# Patient Record
Sex: Male | Born: 1945
Health system: Southern US, Community
[De-identification: ages and names within clinical notes are randomized; demographics above are authoritative.]

## PROBLEM LIST (undated history)

## (undated) DIAGNOSIS — Z85850 Personal history of malignant neoplasm of thyroid: Secondary | ICD-10-CM

## (undated) DIAGNOSIS — I2510 Atherosclerotic heart disease of native coronary artery without angina pectoris: Secondary | ICD-10-CM

## (undated) DIAGNOSIS — E079 Disorder of thyroid, unspecified: Secondary | ICD-10-CM

## (undated) DIAGNOSIS — I10 Essential (primary) hypertension: Secondary | ICD-10-CM

## (undated) DIAGNOSIS — E785 Hyperlipidemia, unspecified: Secondary | ICD-10-CM

## (undated) DIAGNOSIS — R739 Hyperglycemia, unspecified: Secondary | ICD-10-CM

## (undated) HISTORY — DX: Hyperlipidemia, unspecified: E78.5

## (undated) HISTORY — DX: Personal history of malignant neoplasm of thyroid: Z85.850

## (undated) HISTORY — PX: OTHER SURGICAL HISTORY: SHX169

## (undated) HISTORY — DX: Atherosclerotic heart disease of native coronary artery without angina pectoris: I25.10

## (undated) HISTORY — DX: Hyperglycemia, unspecified: R73.9

## (undated) HISTORY — DX: Disorder of thyroid, unspecified: E07.9

## (undated) HISTORY — DX: Essential (primary) hypertension: I10

---

## 1998-07-11 ENCOUNTER — Encounter: Payer: Self-pay | Admitting: Family Medicine

## 1998-07-11 ENCOUNTER — Ambulatory Visit (HOSPITAL_COMMUNITY): Admission: RE | Admit: 1998-07-11 | Discharge: 1998-07-11 | Payer: Self-pay | Admitting: Family Medicine

## 1998-11-25 ENCOUNTER — Other Ambulatory Visit: Admission: RE | Admit: 1998-11-25 | Discharge: 1998-11-25 | Payer: Self-pay | Admitting: General Surgery

## 1999-03-27 ENCOUNTER — Ambulatory Visit (HOSPITAL_COMMUNITY): Admission: RE | Admit: 1999-03-27 | Discharge: 1999-03-27 | Payer: Self-pay | Admitting: Family Medicine

## 1999-03-27 ENCOUNTER — Encounter: Payer: Self-pay | Admitting: Family Medicine

## 1999-09-19 DIAGNOSIS — I10 Essential (primary) hypertension: Secondary | ICD-10-CM | POA: Insufficient documentation

## 2004-05-08 ENCOUNTER — Ambulatory Visit: Payer: Self-pay | Admitting: Family Medicine

## 2004-05-18 ENCOUNTER — Ambulatory Visit: Payer: Self-pay | Admitting: Family Medicine

## 2004-06-05 ENCOUNTER — Ambulatory Visit: Payer: Self-pay | Admitting: Family Medicine

## 2004-07-16 DIAGNOSIS — E890 Postprocedural hypothyroidism: Secondary | ICD-10-CM | POA: Insufficient documentation

## 2004-07-16 HISTORY — PX: THYROIDECTOMY: SHX17

## 2004-07-24 ENCOUNTER — Ambulatory Visit: Payer: Self-pay | Admitting: Family Medicine

## 2004-08-08 ENCOUNTER — Ambulatory Visit: Payer: Self-pay | Admitting: Family Medicine

## 2004-12-14 ENCOUNTER — Other Ambulatory Visit: Payer: Self-pay

## 2004-12-20 ENCOUNTER — Ambulatory Visit: Payer: Self-pay | Admitting: General Surgery

## 2005-01-03 DIAGNOSIS — C73 Malignant neoplasm of thyroid gland: Secondary | ICD-10-CM | POA: Insufficient documentation

## 2005-02-05 IMAGING — CT CT NECK WITH CONTRAST
1 of 3 series · 9 of 14 positions shown, 12 images · non-contrast
Comparison: none

REASON FOR EXAM: dysphagia
COMMENTS:

[Series 3: inspace · axial · 0.45mm/px · z∈[-378,-155]mm · 9 of 349 slices shown, 12 images]
[im 35/349  soft-tissue]
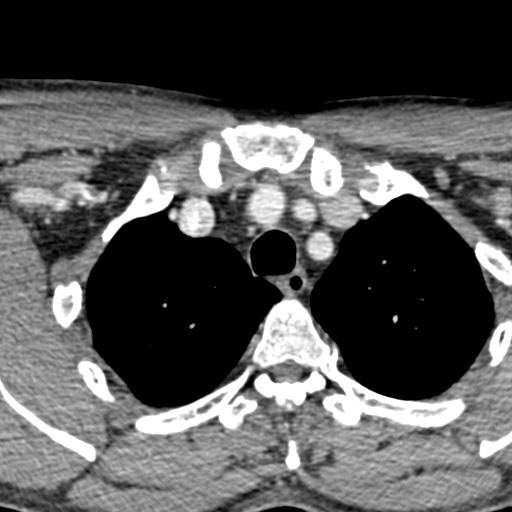
[im 35/349  bone]
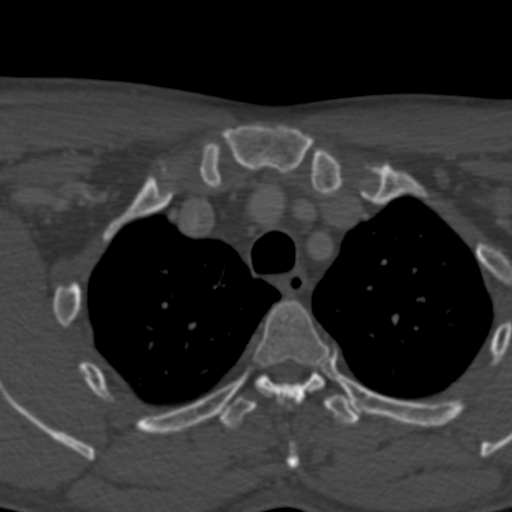
[im 70/349  bone]
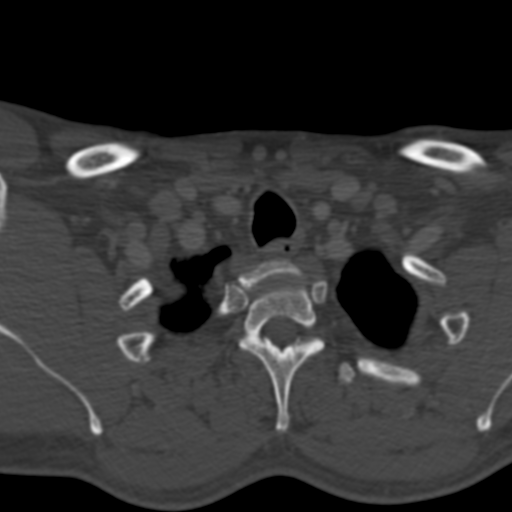
[im 105/349  bone]
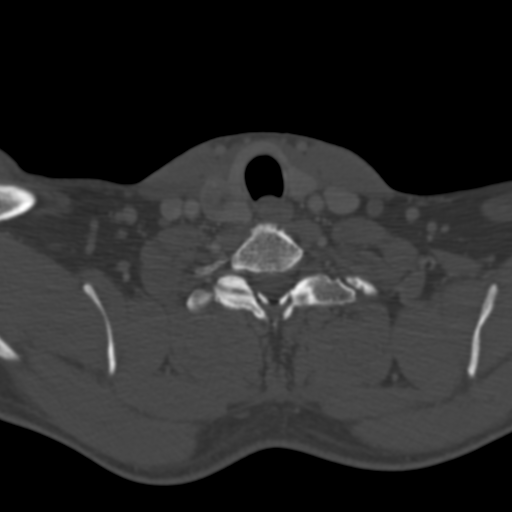
[im 140/349  bone]
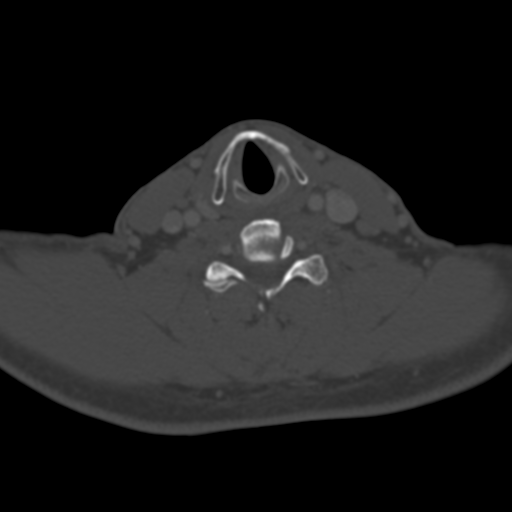
[im 175/349  soft-tissue]
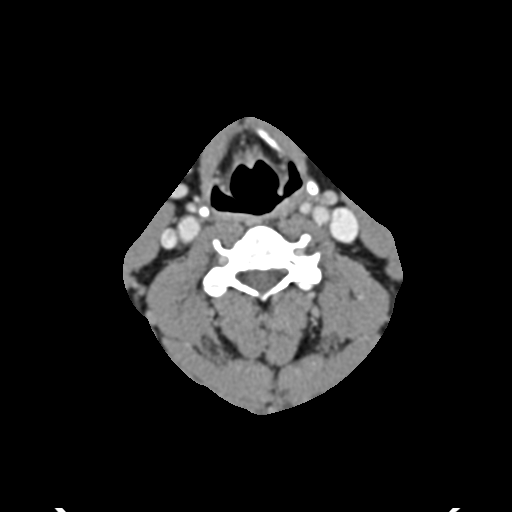
[im 175/349  bone]
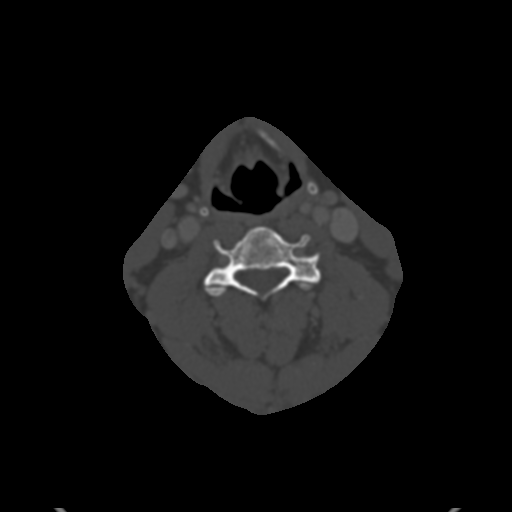
[im 209/349  bone]
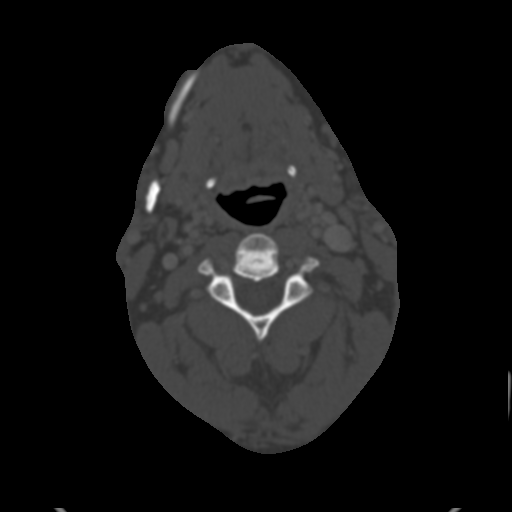
[im 244/349  bone]
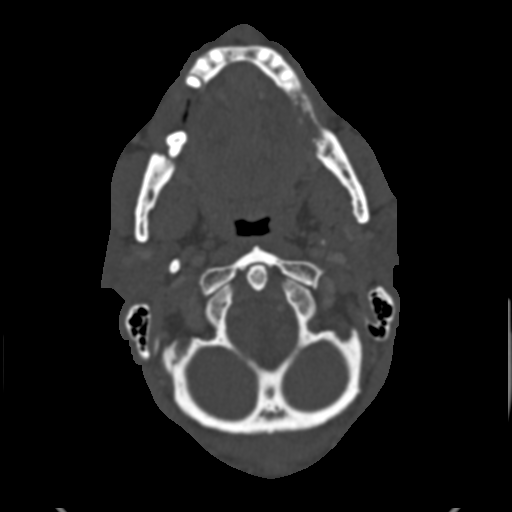
[im 279/349  bone]
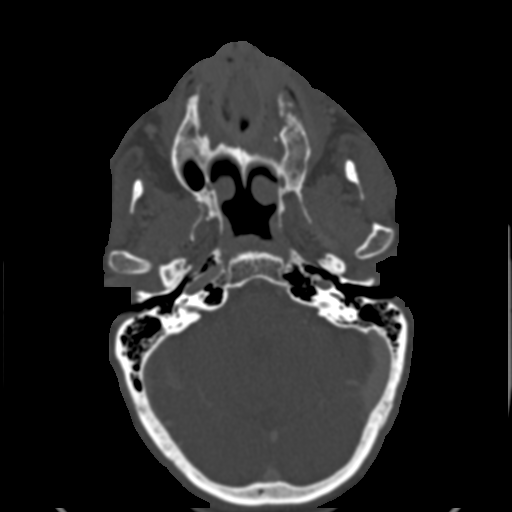
[im 314/349  soft-tissue]
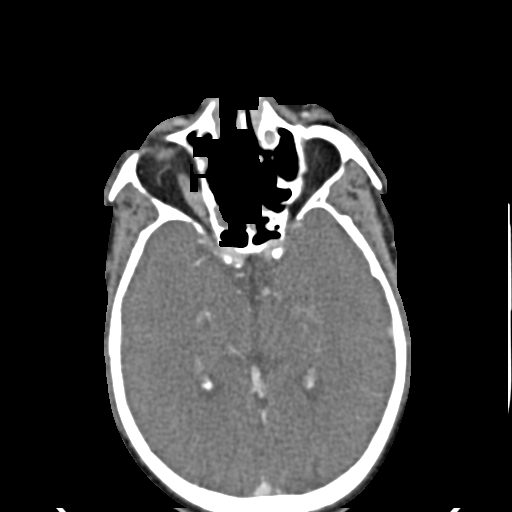
[im 314/349  bone]
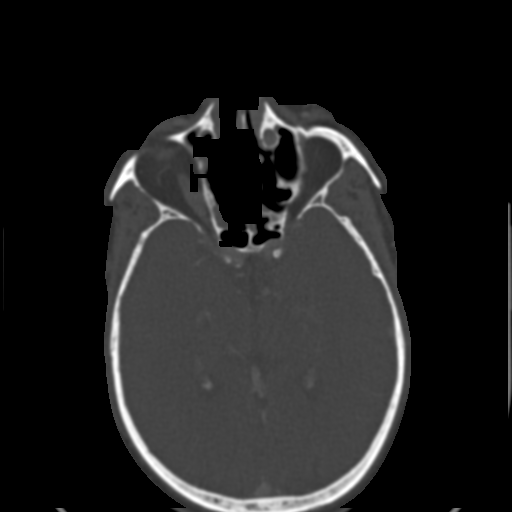

[9 of 14 positions shown; findings below may reference images not displayed]

PROCEDURE:     CT  - CT NECK WITH CONTRAST  - June 05, 2004 [DATE]

RESULT:     Standard IV contrast enhanced Neck CT is obtained and reveals a
normal skull base.  The salivary glands are normal. Shotty anterior cervical
lymph nodes are noted particularly on the RIGHT. No pathologic lymph nodes
are noted using CT size criteria.  The epiglottis and larynx are normal. The
cervical airways are patent.  There is a low-density 1 cm lesion in the
RIGHT lobe of the thyroid for which an 4-WQR thyroid scan is suggested for
further evaluation.
IMPRESSION: 1)Shotty RIGHT anterior cervical lymph nodes, these are nonspecific.

2)1 cm low-density lesion in the RIGHT lobe of the thyroid.  This is a new
finding from prior CT of 11-08-99 and 4-WQR thyroid scan and/or thyroid
ultrasound should be considered for further evaluation.

## 2007-07-17 DIAGNOSIS — E782 Mixed hyperlipidemia: Secondary | ICD-10-CM | POA: Insufficient documentation

## 2008-12-17 DIAGNOSIS — Z87891 Personal history of nicotine dependence: Secondary | ICD-10-CM | POA: Insufficient documentation

## 2008-12-19 DIAGNOSIS — Z8249 Family history of ischemic heart disease and other diseases of the circulatory system: Secondary | ICD-10-CM | POA: Insufficient documentation

## 2010-06-01 ENCOUNTER — Inpatient Hospital Stay: Payer: Self-pay | Admitting: Internal Medicine

## 2015-01-03 ENCOUNTER — Other Ambulatory Visit: Payer: Self-pay | Admitting: Family Medicine

## 2015-02-06 ENCOUNTER — Other Ambulatory Visit: Payer: Self-pay | Admitting: Family Medicine

## 2015-10-30 ENCOUNTER — Other Ambulatory Visit: Payer: Self-pay | Admitting: Family Medicine

## 2015-12-23 ENCOUNTER — Other Ambulatory Visit: Payer: Self-pay | Admitting: Family Medicine

## 2015-12-24 DIAGNOSIS — Z85850 Personal history of malignant neoplasm of thyroid: Secondary | ICD-10-CM | POA: Insufficient documentation

## 2015-12-24 NOTE — Telephone Encounter (Signed)
Last OV 11/2014  Thanks,   -Retia Cordle  

## 2016-01-20 ENCOUNTER — Other Ambulatory Visit: Payer: Self-pay | Admitting: *Deleted

## 2016-01-20 MED ORDER — TRIAMTERENE-HCTZ 37.5-25 MG PO TABS: 1.0000 | ORAL_TABLET | Freq: Every day | ORAL | Status: DC

## 2016-01-24 ENCOUNTER — Other Ambulatory Visit: Payer: Self-pay

## 2016-01-24 MED ORDER — LEVOTHYROXINE SODIUM 100 MCG PO TABS: 100.0000 ug | ORAL_TABLET | Freq: Every day | ORAL | Status: DC

## 2016-01-24 NOTE — Telephone Encounter (Signed)
Pharmacy faxed refill request. Patient is over due for office visit. I tried calling patient. No answer. Left message on his voice message system that he needs to schedule a follow up appointment.

## 2016-02-28 ENCOUNTER — Ambulatory Visit (INDEPENDENT_AMBULATORY_CARE_PROVIDER_SITE_OTHER): Payer: BLUE CROSS/BLUE SHIELD | Admitting: Family Medicine

## 2016-02-28 ENCOUNTER — Encounter: Payer: Self-pay | Admitting: Family Medicine

## 2016-02-28 VITALS — BP 138/62 | HR 54 | Temp 98.7°F | Resp 16 | Wt 192.0 lb

## 2016-02-28 DIAGNOSIS — I2510 Atherosclerotic heart disease of native coronary artery without angina pectoris: Secondary | ICD-10-CM

## 2016-02-28 DIAGNOSIS — F329 Major depressive disorder, single episode, unspecified: Secondary | ICD-10-CM | POA: Insufficient documentation

## 2016-02-28 DIAGNOSIS — E890 Postprocedural hypothyroidism: Secondary | ICD-10-CM

## 2016-02-28 DIAGNOSIS — F32A Depression, unspecified: Secondary | ICD-10-CM | POA: Insufficient documentation

## 2016-02-28 DIAGNOSIS — E782 Mixed hyperlipidemia: Secondary | ICD-10-CM

## 2016-02-28 NOTE — Progress Notes (Signed)
Patient: John Liu Male    DOB: 1946/03/21   70 y.o.   MRN: [id number] Visit Date: 02/28/2016  Today's Provider: Lelon Huh, MD   Chief Complaint  Patient presents with  . Hyperlipidemia    follow up  . Hypothyroidism    follow up  . Hypertension    follow up   Subjective:    HPI  Lipid/Cholesterol, Follow-up:   Last seen for this1 years ago.  Management changes since that visit include patient deciding to stop Stain therapy due to trouble swallowing the large pills. Patient has started taking Red yeast rice pills. Last Lipid Panel: No results found for: CHOL, TRIG, HDL, CHOLHDL, VLDL, LDLCALC, LDLDIRECT  Risk factors for vascular disease include hypercholesterolemia and hypertension  He reports poor compliance with treatment. He is not having side effects.  Current symptoms include none and have been stable. Weight trend: stable Prior visit with dietician: no Current diet: in general, a "healthy" diet   Current exercise: walking and weightlifting  Wt Readings from Last 3 Encounters:  No data found for Wt    ------------------------------------------------------------------- Follow up Hypothyroidism:  Patient was last seen 1 year ago and no changes were made. Patient reports good compliance with treatment and good tolerance.    Hypertension, follow-up:  BP Readings from Last 3 Encounters:  No data found for BP    He was last seen for hypertension 1 years ago.  BP at that visit was 130/70. Management since that visit includes no changes. He reports good compliance with treatment. He is not having side effects.  He is exercising. He is adherent to low salt diet.   Outside blood pressures are not being checked. He is experiencing none.  Patient denies chest pain, chest pressure/discomfort, claudication, dyspnea, exertional chest pressure/discomfort, fatigue, irregular heart beat, lower extremity edema, near-syncope, orthopnea, palpitations,  paroxysmal nocturnal dyspnea, syncope and tachypnea.   Cardiovascular risk factors include advanced age (older than 27 for men, 69 for women), hypertension and male gender.  Use of agents associated with hypertension: NSAIDS.     Weight trend: stable Wt Readings from Last 3 Encounters:  No data found for Wt    Current diet: in general, a "healthy" diet    ------------------------------------------------------------------------   Coronary artery disease, follow up  Feels well with no chest pains, dyspnea, dizziness, or palpitations. Has not seen Dr. Nehemiah Massed for a few years. Takes 81 ASA but not every day because he bleeds easily.   ------------------------------------------------------------------------       Allergies not on file Current Meds  Medication Sig  . aspirin 81 MG tablet Take 1 tablet by mouth daily.  Marland Kitchen levothyroxine (SYNTHROID, LEVOTHROID) 100 MCG tablet Take 1 tablet (100 mcg total) by mouth daily.  Marland Kitchen lisinopril (PRINIVIL,ZESTRIL) 40 MG tablet TAKE 1 TABLET EVERY DAY  . Red Yeast Rice Extract (RED YEAST RICE PO) Take 1 capsule by mouth daily.  Marland Kitchen triamterene-hydrochlorothiazide (MAXZIDE-25) 37.5-25 MG tablet Take 1 tablet by mouth daily.    Review of Systems  Constitutional: Negative for appetite change, chills and fever.  Respiratory: Negative for chest tightness, shortness of breath and wheezing.   Cardiovascular: Negative for chest pain and palpitations.  Gastrointestinal: Negative for abdominal pain, nausea and vomiting.  Endocrine: Negative for cold intolerance, heat intolerance, polydipsia, polyphagia and polyuria.    Social History  Substance Use Topics  . Smoking status: Former Smoker    Packs/day: 1.00    Years: 30.00  Types: Cigarettes    Quit date: 05/16/2010  . Smokeless tobacco: Former Systems developer  . Alcohol use Yes     Comment: Drinks beer; Moderate use   Objective:   BP 138/62 (BP Location: Left Arm, Patient Position: Sitting, Cuff Size:  Large)   Pulse (!) 54   Temp 98.7 F (37.1 C) (Oral)   Resp 16   Wt 192 lb (87.1 kg)   SpO2 96% Comment: room air  Physical Exam   General Appearance:    Alert, cooperative, no distress  Eyes:    PERRL, conjunctiva/corneas clear, EOM's intact       Lungs:     Clear to auscultation bilaterally, respirations unlabored  Heart:    Regular rate and rhythm  Neurologic:   Awake, alert, oriented x 3. No apparent focal neurological           defect.           Assessment & Plan:     1. Hyperlipidemia, mixed Due for labs.  - Lipid panel - Hepatic function panel  2. Hypothyroidism, postop Due to check TSH - T4 AND TSH  3. Coronary artery disease involving native coronary artery of native heart without angina pectoris Asymptomatic. Compliant with medication.  Continue aggressive risk factor modification.    - Renal function panel - EKG 12-Lead     The entirety of the information documented in the History of Present Illness, Review of Systems and Physical Exam were personally obtained by me. Portions of this information were initially documented by Meyer Cory, CMA and reviewed by me for thoroughness and accuracy.    Lelon Huh, MD  [facility]

## 2016-02-28 NOTE — Patient Instructions (Signed)
Limit alcohol to no more than 3 drinks in a day.

## 2016-03-10 ENCOUNTER — Other Ambulatory Visit: Payer: Self-pay | Admitting: Family Medicine

## 2016-03-14 DIAGNOSIS — E890 Postprocedural hypothyroidism: Secondary | ICD-10-CM | POA: Diagnosis not present

## 2016-03-14 DIAGNOSIS — I2510 Atherosclerotic heart disease of native coronary artery without angina pectoris: Secondary | ICD-10-CM | POA: Diagnosis not present

## 2016-03-14 DIAGNOSIS — E782 Mixed hyperlipidemia: Secondary | ICD-10-CM | POA: Diagnosis not present

## 2016-03-15 LAB — HEPATIC FUNCTION PANEL
ALK PHOS: 77 IU/L (ref 39–117)
ALT: 27 IU/L (ref 0–44)
AST: 23 IU/L (ref 0–40)
BILIRUBIN TOTAL: 0.5 mg/dL (ref 0.0–1.2)
Bilirubin, Direct: 0.11 mg/dL (ref 0.00–0.40)
TOTAL PROTEIN: 7.1 g/dL (ref 6.0–8.5)

## 2016-03-15 LAB — RENAL FUNCTION PANEL
Albumin: 4.3 g/dL (ref 3.6–4.8)
BUN/Creatinine Ratio: 14 (ref 10–24)
BUN: 18 mg/dL (ref 8–27)
CALCIUM: 9.5 mg/dL (ref 8.6–10.2)
CO2: 26 mmol/L (ref 18–29)
CREATININE: 1.31 mg/dL — AB (ref 0.76–1.27)
Chloride: 100 mmol/L (ref 96–106)
GFR calc Af Amer: 65 mL/min/{1.73_m2} (ref >59)
GFR calc non Af Amer: 56 mL/min/{1.73_m2} — ABNORMAL LOW (ref >59)
Glucose: 115 mg/dL — ABNORMAL HIGH (ref 65–99)
PHOSPHORUS: 3.8 mg/dL (ref 2.5–4.5)
Potassium: 4.2 mmol/L (ref 3.5–5.2)
SODIUM: 141 mmol/L (ref 134–144)

## 2016-03-15 LAB — LIPID PANEL
CHOLESTEROL TOTAL: 248 mg/dL — AB (ref 100–199)
Chol/HDL Ratio: 6.4 ratio units — ABNORMAL HIGH (ref 0.0–5.0)
HDL: 39 mg/dL — ABNORMAL LOW (ref >39)
LDL Calculated: 151 mg/dL — ABNORMAL HIGH (ref 0–99)
TRIGLYCERIDES: 291 mg/dL — AB (ref 0–149)
VLDL Cholesterol Cal: 58 mg/dL — ABNORMAL HIGH (ref 5–40)

## 2016-03-15 LAB — T4 AND TSH
T4, Total: 6.7 ug/dL (ref 4.5–12.0)
TSH: 0.667 u[IU]/mL (ref 0.450–4.500)

## 2016-03-22 ENCOUNTER — Telehealth: Payer: Self-pay | Admitting: Family Medicine

## 2016-03-22 MED ORDER — PRAVASTATIN SODIUM 40 MG PO TABS: 40.0000 mg | ORAL_TABLET | Freq: Every day | ORAL | 3 refills | Status: DC

## 2016-03-22 NOTE — Telephone Encounter (Signed)
Pt wife Enid Derry is returning call.  [phone]

## 2016-03-22 NOTE — Telephone Encounter (Signed)
-----   Message from Birdie Sons, MD sent at 03/15/2016  8:09 AM EDT ----- Cholesterol is much too high at 248, needs to be under 200. Start pravastatin 40mg  once a day, #30, rf x 3 and follow up for cholesterol in 3 months.

## 2016-03-22 NOTE — Telephone Encounter (Signed)
Patient's wife Enid Derry was notified of results. Expressed understanding. Rx was sent to pharmacy.

## 2016-06-14 ENCOUNTER — Other Ambulatory Visit: Payer: Self-pay | Admitting: *Deleted

## 2016-06-14 MED ORDER — PRAVASTATIN SODIUM 40 MG PO TABS: 40.0000 mg | ORAL_TABLET | Freq: Every day | ORAL | 3 refills | Status: DC

## 2016-06-14 NOTE — Telephone Encounter (Signed)
Requesting 90 day supply.

## 2016-08-03 DIAGNOSIS — H353121 Nonexudative age-related macular degeneration, left eye, early dry stage: Secondary | ICD-10-CM | POA: Diagnosis not present

## 2016-08-03 DIAGNOSIS — H40053 Ocular hypertension, bilateral: Secondary | ICD-10-CM | POA: Diagnosis not present

## 2016-09-04 DIAGNOSIS — H40053 Ocular hypertension, bilateral: Secondary | ICD-10-CM | POA: Diagnosis not present

## 2016-09-04 DIAGNOSIS — H353121 Nonexudative age-related macular degeneration, left eye, early dry stage: Secondary | ICD-10-CM | POA: Diagnosis not present

## 2016-09-04 DIAGNOSIS — H2513 Age-related nuclear cataract, bilateral: Secondary | ICD-10-CM | POA: Diagnosis not present

## 2016-10-26 DIAGNOSIS — H401131 Primary open-angle glaucoma, bilateral, mild stage: Secondary | ICD-10-CM | POA: Diagnosis not present

## 2016-11-07 ENCOUNTER — Other Ambulatory Visit: Payer: Self-pay | Admitting: Family Medicine

## 2016-11-09 DIAGNOSIS — H2513 Age-related nuclear cataract, bilateral: Secondary | ICD-10-CM | POA: Diagnosis not present

## 2016-11-09 DIAGNOSIS — H401131 Primary open-angle glaucoma, bilateral, mild stage: Secondary | ICD-10-CM | POA: Diagnosis not present

## 2016-11-09 DIAGNOSIS — H40053 Ocular hypertension, bilateral: Secondary | ICD-10-CM | POA: Diagnosis not present

## 2016-11-13 ENCOUNTER — Ambulatory Visit (INDEPENDENT_AMBULATORY_CARE_PROVIDER_SITE_OTHER): Payer: BLUE CROSS/BLUE SHIELD | Admitting: Family Medicine

## 2016-11-13 ENCOUNTER — Encounter: Payer: Self-pay | Admitting: Family Medicine

## 2016-11-13 VITALS — BP 130/74 | HR 57 | Temp 98.3°F | Resp 16 | Wt 194.0 lb

## 2016-11-13 DIAGNOSIS — M545 Low back pain, unspecified: Secondary | ICD-10-CM

## 2016-11-13 MED ORDER — CYCLOBENZAPRINE HCL 5 MG PO TABS: 5.0000 mg | ORAL_TABLET | Freq: Three times a day (TID) | ORAL | 1 refills | Status: AC | PRN

## 2016-11-13 NOTE — Patient Instructions (Signed)
Low Back Sprain A sprain is a stretch or tear in the bands of tissue that hold bones and joints together (ligaments). Sprains of the lower back (lumbar spine) are a common cause of low back pain. A sprain occurs when ligaments are overextended or stretched beyond their limits. The ligaments can become inflamed, resulting in pain and sudden muscle tightening (spasms). A sprain can be caused by an injury (trauma), or it can develop gradually due to overuse. There are three types of sprains:  Grade 1 is a mild sprain involving an overstretched ligament or a very slight tear of the ligament.  Grade 2 is a moderate sprain involving a partial tear of the ligament.  Grade 3 is a severe sprain involving a complete tear of the ligament. What are the causes? This condition may be caused by:  Trauma, such as a fall or a hit to the body.  Twisting or overstretching the back. This may result from doing activities that require a lot of energy, such as lifting heavy objects. What increases the risk? The following factors may increase your risk of getting this condition:  Playing contact sports.  Participating in sports or activities that put excessive stress on the back and require a lot of bending and twisting, including:  Lifting weights or heavy objects.  Gymnastics.  Soccer.  Figure skating.  Snowboarding.  Being overweight or obese.  Having poor strength and flexibility. What are the signs or symptoms? Symptoms of this condition may include:  Sharp or dull pain in the lower back that does not go away. Pain may extend to the buttocks.  Stiffness.  Limited range of motion.  Inability to stand up straight due to stiffness or pain.  Muscle spasms. How is this diagnosed?   This condition may be diagnosed based on:  Your symptoms.  Your medical history.  A physical exam.  Your health care provider may push on certain areas of your back to determine the source of your  pain.  You may be asked to bend forward, backward, and side to side to assess the severity of your pain and your range of motion.  Imaging tests, such as:  X-rays.  MRI. How is this treated? Treatment for this condition may include:  Applying heat and cold to the affected area.  Medicines to help relieve pain and to relax your muscles (muscle relaxants).  NSAIDs to help reduce swelling and discomfort.  Physical therapy. When your symptoms improve, it is important to gradually return to your normal routine as soon as possible to reduce pain, avoid stiffness, and avoid loss of muscle strength. Generally, symptoms should improve within 6 weeks of treatment. However, recovery time varies. Follow these instructions at home: Managing pain, stiffness, and swelling   If directed, apply ice to the injured area during the first 24 hours after your injury.  Put ice in a plastic bag.  Place a towel between your skin and the bag.  Leave the ice on for 20 minutes, 2-3 times a day.  If directed, apply heat to the affected area as often as told by your health care provider. Use the heat source that your health care provider recommends, such as a moist heat pack or a heating pad.  Place a towel between your skin and the heat source.  Leave the heat on for 20-30 minutes.  Remove the heat if your skin turns bright red. This is especially important if you are unable to feel pain, heat, or cold. You  may have a greater risk of getting burned. Activity   Rest and return to your normal activities as told by your health care provider. Ask your health care provider what activities are safe for you.  Avoid activities that take a lot of effort (are strenuous) for as long as told by your health care provider.  Do exercises as told by your health care provider. General instructions    Take over-the-counter and prescription medicines only as told by your health care provider.  If you have  questions or concerns about safety while taking pain medicine, talk with your health care provider.  Do not drive or operate heavy machinery until you know how your pain medicine affects you.  Do not use any tobacco products, such as cigarettes, chewing tobacco, and e-cigarettes. Tobacco can delay bone healing. If you need help quitting, ask your health care provider.  Keep all follow-up visits as told by your health care provider. This is important. How is this prevented?  Warm up and stretch before being active.  Cool down and stretch after being active.  Give your body time to rest between periods of activity.  Avoid:  Being physically inactive for long periods at a time.  Exercising or playing sports when you are tired or in pain.  Use correct form when playing sports and lifting heavy objects.  Use good posture when sitting and standing.  Maintain a healthy weight.  Sleep on a mattress with medium firmness to support your back.  Make sure to use equipment that fits you, including shoes that fit well.  Be safe and responsible while being active to avoid falls.  Do at least 150 minutes of moderate-intensity exercise each week, such as brisk walking or water aerobics. Try a form of exercise that takes stress off your back, such as swimming or stationary cycling.  Maintain physical fitness, including:  Strength. In particular, develop and maintain strong abdominal muscles.  Flexibility.  Cardiovascular fitness.  Endurance. Contact a health care provider if:  Your back pain does not improve after 6 weeks of treatment.  Your symptoms get worse. Get help right away if:  Your back pain is severe.  You are unable to stand or walk.  You develop pain in your legs.  You develop weakness in your buttocks or legs.  You have difficulty controlling when you urinate or when you have a bowel movement. This information is not intended to replace advice given to you by  your health care provider. Make sure you discuss any questions you have with your health care provider. Document Released: 07/02/2005 Document Revised: 03/08/2016 Document Reviewed: 04/13/2015 Elsevier Interactive Patient Education  2017 [employer].

## 2016-11-13 NOTE — Progress Notes (Signed)
Patient: John Liu Male    DOB: 12-12-1945   71 y.o.   MRN: [id number] Visit Date: 11/13/2016  Today's Provider: Lelon Huh, MD   Chief Complaint  Patient presents with  . Back Pain   Subjective:    Patient stated that his back began hurting 2-3 weeks ago. Patient states pain is on right side lower back with radiation going to right flank. Patient states pain and stiffness occurs throughput the day. Patient is not taking any medications for pain.   Back Pain  This is a new problem. The current episode started 1 to 4 weeks ago (2-3 weeks). The problem occurs constantly. The problem has been gradually worsening since onset. The pain is present in the lumbar spine. The quality of the pain is described as aching and shooting. Radiates to: radiates to right side. The pain is at a severity of 7/10. The pain is moderate. The pain is worse during the day. The symptoms are aggravated by bending, position, standing and twisting. Stiffness is present all day. Pertinent negatives include no abdominal pain, bladder incontinence, bowel incontinence, chest pain, dysuria, fever, headaches, leg pain, numbness, paresis, paresthesias, pelvic pain, perianal numbness, tingling, weakness or weight loss. He has tried nothing for the symptoms.   Has not taken any OTC medications.     No Known Allergies   Current Outpatient Prescriptions:  .  aspirin 81 MG tablet, Take 1 tablet by mouth daily., Disp: , Rfl:  .  levothyroxine (SYNTHROID, LEVOTHROID) 100 MCG tablet, TAKE 1 TABLET (100 MCG TOTAL) BY MOUTH DAILY., Disp: 30 tablet, Rfl: 11 .  lisinopril (PRINIVIL,ZESTRIL) 40 MG tablet, TAKE 1 TABLET EVERY DAY, Disp: 30 tablet, Rfl: 11 .  pravastatin (PRAVACHOL) 40 MG tablet, Take 1 tablet (40 mg total) by mouth daily., Disp: 90 tablet, Rfl: 3 .  triamterene-hydrochlorothiazide (MAXZIDE-25) 37.5-25 MG tablet, Take 1 tablet by mouth daily., Disp: 30 tablet, Rfl: 11  Review of Systems    Constitutional: Negative for appetite change, chills, fever and weight loss.  Respiratory: Negative for chest tightness, shortness of breath and wheezing.   Cardiovascular: Negative for chest pain and palpitations.  Gastrointestinal: Negative for abdominal pain, bowel incontinence, nausea and vomiting.  Genitourinary: Negative for bladder incontinence, dysuria and pelvic pain.  Musculoskeletal: Positive for back pain.  Neurological: Negative for tingling, weakness, numbness, headaches and paresthesias.    Social History  Substance Use Topics  . Smoking status: Former Smoker    Packs/day: 0.75    Years: 30.00    Types: Cigarettes    Quit date: 05/16/2010  . Smokeless tobacco: Former Systems developer  . Alcohol use Yes     Comment: Drinks beer; Moderate use   Objective:   BP 130/74 (BP Location: Right Arm, Patient Position: Sitting, Cuff Size: Large)   Pulse (!) 57   Temp 98.3 F (36.8 C) (Oral)   Resp 16   Wt 194 lb (88 kg)   SpO2 95%  Vitals:   11/13/16 1057  BP: 130/74  Pulse: (!) 57  Resp: 16  Temp: 98.3 F (36.8 C)  TempSrc: Oral  SpO2: 95%  Weight: 194 lb (88 kg)     Physical Exam  General appearance: alert, well developed, well nourished, cooperative and in no distress Head: Normocephalic, without obvious abnormality, atraumatic Respiratory: Respirations even and unlabored, normal respiratory rate MS: Mild tenderness right para lumbar muscles. No gross deformities.      Assessment & Plan:     1.  Acute right-sided low back pain without sciatica Recommend heat therapy. Consider PT if not greatly improved in 2-3 weeks.  - cyclobenzaprine (FLEXERIL) 5 MG tablet; Take 1 tablet (5 mg total) by mouth 3 (three) times daily as needed (back pain).  Dispense: 30 tablet; Refill: 1     The entirety of the information documented in the History of Present Illness, Review of Systems and Physical Exam were personally obtained by me. Portions of this information were initially  documented by April M. Sabra Heck, CMA and reviewed by me for thoroughness and accuracy.    Lelon Huh, MD  [facility]

## 2017-01-06 ENCOUNTER — Other Ambulatory Visit: Payer: Self-pay | Admitting: Family Medicine

## 2017-02-11 DIAGNOSIS — H401131 Primary open-angle glaucoma, bilateral, mild stage: Secondary | ICD-10-CM | POA: Diagnosis not present

## 2017-02-11 DIAGNOSIS — H2513 Age-related nuclear cataract, bilateral: Secondary | ICD-10-CM | POA: Diagnosis not present

## 2017-02-11 DIAGNOSIS — H40053 Ocular hypertension, bilateral: Secondary | ICD-10-CM | POA: Diagnosis not present

## 2017-02-11 DIAGNOSIS — H353121 Nonexudative age-related macular degeneration, left eye, early dry stage: Secondary | ICD-10-CM | POA: Diagnosis not present

## 2017-02-21 ENCOUNTER — Other Ambulatory Visit: Payer: Self-pay | Admitting: Family Medicine

## 2017-03-23 ENCOUNTER — Other Ambulatory Visit: Payer: Self-pay | Admitting: Family Medicine

## 2017-04-20 ENCOUNTER — Other Ambulatory Visit: Payer: Self-pay | Admitting: Family Medicine

## 2017-05-14 DIAGNOSIS — H2513 Age-related nuclear cataract, bilateral: Secondary | ICD-10-CM | POA: Diagnosis not present

## 2017-05-14 DIAGNOSIS — H401131 Primary open-angle glaucoma, bilateral, mild stage: Secondary | ICD-10-CM | POA: Diagnosis not present

## 2017-05-14 DIAGNOSIS — H353121 Nonexudative age-related macular degeneration, left eye, early dry stage: Secondary | ICD-10-CM | POA: Diagnosis not present

## 2017-05-14 DIAGNOSIS — H40053 Ocular hypertension, bilateral: Secondary | ICD-10-CM | POA: Diagnosis not present

## 2017-08-22 DIAGNOSIS — H2513 Age-related nuclear cataract, bilateral: Secondary | ICD-10-CM | POA: Diagnosis not present

## 2017-08-22 DIAGNOSIS — H353121 Nonexudative age-related macular degeneration, left eye, early dry stage: Secondary | ICD-10-CM | POA: Diagnosis not present

## 2017-08-22 DIAGNOSIS — H40053 Ocular hypertension, bilateral: Secondary | ICD-10-CM | POA: Diagnosis not present

## 2017-08-22 DIAGNOSIS — H401131 Primary open-angle glaucoma, bilateral, mild stage: Secondary | ICD-10-CM | POA: Diagnosis not present

## 2017-10-01 ENCOUNTER — Other Ambulatory Visit: Payer: Self-pay | Admitting: Family Medicine

## 2017-11-25 DIAGNOSIS — H353121 Nonexudative age-related macular degeneration, left eye, early dry stage: Secondary | ICD-10-CM | POA: Diagnosis not present

## 2017-11-25 DIAGNOSIS — H40053 Ocular hypertension, bilateral: Secondary | ICD-10-CM | POA: Diagnosis not present

## 2017-11-25 DIAGNOSIS — H401131 Primary open-angle glaucoma, bilateral, mild stage: Secondary | ICD-10-CM | POA: Diagnosis not present

## 2017-11-25 DIAGNOSIS — H2513 Age-related nuclear cataract, bilateral: Secondary | ICD-10-CM | POA: Diagnosis not present

## 2017-12-18 ENCOUNTER — Other Ambulatory Visit: Payer: Self-pay | Admitting: Family Medicine

## 2017-12-26 NOTE — Progress Notes (Signed)
Patient: John Liu Male    DOB: 10-Dec-1945   71 y.o.   MRN: [id number] Visit Date: 12/27/2017  Today's Provider: Lelon Huh, MD   Chief Complaint  Patient presents with  . Follow-up  . Hypertension  . Hyperlipidemia  . Hypothyroidism   Subjective:    HPI   Hypertension, follow-up:  BP Readings from Last 3 Encounters:  12/27/17 (!) 112/54  11/13/16 130/74  02/28/16 138/62    He was last seen for hypertension 1 years ago.  BP at that visit was 138/62. Management since that visit includes; no cahnges.He reports good compliance with treatment. He is not having side effects. none He some exercising. He is adherent to low salt diet.   Outside blood pressures are not checking. He is experiencing none.  Patient denies none.   Cardiovascular risk factors include advanced age (older than 35 for men, 53 for women).  Use of agents associated with hypertension: none   He states his wife recently had to go to ER for what sounds like angioedema due to lisinopril and he is concerned about the safety of staying on the medication himself. He is interested in changing to a different medication.  ----------------------------------------------------------------- .    Patient states he has had a dull headache for 2-3 months. Patient states he believes he is having sinus problems. Patient states he is also having nasal congestion and pressure around his eyes.    No Known Allergies   Current Outpatient Medications:  .  aspirin 81 MG tablet, Take 1 tablet by mouth daily., Disp: , Rfl:  .  levothyroxine (SYNTHROID, LEVOTHROID) 100 MCG tablet, TAKE 1 TABLET BY MOUTH EVERY DAY, Disp: 30 tablet, Rfl: 12 .  lisinopril (PRINIVIL,ZESTRIL) 40 MG tablet, TAKE 1 TABLET BY MOUTH EVERY DAY, Disp: 30 tablet, Rfl: 0 .  triamterene-hydrochlorothiazide (MAXZIDE-25) 37.5-25 MG tablet, TAKE 1 TABLET BY MOUTH DAILY., Disp: 30 tablet, Rfl: 11 .  pravastatin (PRAVACHOL) 40 MG tablet, Take 1  tablet (40 mg total) by mouth daily. (Patient not taking: Reported on 12/27/2017), Disp: 90 tablet, Rfl: 3  Review of Systems  Constitutional: Negative for appetite change, chills and fever.  HENT: Positive for congestion.   Respiratory: Negative for chest tightness, shortness of breath and wheezing.   Cardiovascular: Negative for chest pain and palpitations.  Gastrointestinal: Negative for abdominal pain, nausea and vomiting.  Neurological: Positive for headaches.    Social History   Tobacco Use  . Smoking status: Former Smoker    Packs/day: 0.75    Years: 30.00    Pack years: 22.50    Types: Cigarettes    Last attempt to quit: 05/16/2010    Years since quitting: 7.6  . Smokeless tobacco: Former Network engineer Use Topics  . Alcohol use: Yes    Comment: Drinks beer; Moderate use   Objective:   BP (!) 112/54 (BP Location: Right Arm, Patient Position: Sitting, Cuff Size: Large)   Pulse (!) 53   Temp 98.1 F (36.7 C) (Oral)   Resp 16   Wt 193 lb (87.5 kg)   SpO2 98%  Vitals:   12/27/17 1352  BP: (!) 112/54  Pulse: (!) 53  Resp: 16  Temp: 98.1 F (36.7 C)  TempSrc: Oral  SpO2: 98%  Weight: 193 lb (87.5 kg)     Physical Exam  General Appearance:    Alert, cooperative, no distress  HENT:   bilateral TM normal without fluid or infection, neck without nodes,  bifrontal sinus tender and nasal mucosa congested  Eyes:    PERRL, conjunctiva/corneas clear, EOM's intact       Lungs:     Clear to auscultation bilaterally, respirations unlabored  Heart:    Regular rate and rhythm  Neurologic:   Awake, alert, oriented x 3. No apparent focal neurological           defect.           Assessment & Plan:     1. Sinusitis, unspecified chronicity, unspecified location  - amoxicillin (AMOXIL) 500 MG capsule; Take 2 capsules (1,000 mg total) by mouth 2 (two) times daily for 7 days.  Dispense: 28 capsule; Refill: 0 - fluticasone (FLONASE) 50 MCG/ACT nasal spray; Place 2 sprays into  both nostrils daily.  Dispense: 16 g; Refill: 6  2. Essential (primary) hypertension Discussed potential risk of angioedema with lisinopril. He is currently on relative high dose but BP is very well controlled, will change to ARB and reassess BP af CPE which is scheduled today.  - valsartan (DIOVAN) 160 MG tablet; Take 1 tablet (160 mg total) by mouth daily.  Dispense: 90 tablet; Refill: 2  3. Hyperlipidemia, mixed He Is currently out of pravastatin and states he is concerned about all the potential side effects. Marland Kitchen He Is going to schedule CPE soon and will check labs at that time.        Lelon Huh, MD  [facility]

## 2017-12-27 ENCOUNTER — Ambulatory Visit: Payer: BLUE CROSS/BLUE SHIELD | Admitting: Family Medicine

## 2017-12-27 ENCOUNTER — Encounter: Payer: Self-pay | Admitting: Family Medicine

## 2017-12-27 VITALS — BP 112/54 | HR 53 | Temp 98.1°F | Resp 16 | Wt 193.0 lb

## 2017-12-27 DIAGNOSIS — E782 Mixed hyperlipidemia: Secondary | ICD-10-CM | POA: Diagnosis not present

## 2017-12-27 DIAGNOSIS — I10 Essential (primary) hypertension: Secondary | ICD-10-CM | POA: Diagnosis not present

## 2017-12-27 DIAGNOSIS — J329 Chronic sinusitis, unspecified: Secondary | ICD-10-CM | POA: Diagnosis not present

## 2017-12-27 MED ORDER — VALSARTAN 160 MG PO TABS: 160.0000 mg | ORAL_TABLET | Freq: Every day | ORAL | 2 refills | Status: DC

## 2017-12-27 MED ORDER — FLUTICASONE PROPIONATE 50 MCG/ACT NA SUSP: 2.0000 | Freq: Every day | NASAL | 6 refills | Status: DC

## 2017-12-27 MED ORDER — AMOXICILLIN 500 MG PO CAPS: 1000.0000 mg | ORAL_CAPSULE | Freq: Two times a day (BID) | ORAL | 0 refills | Status: AC

## 2017-12-29 ENCOUNTER — Other Ambulatory Visit: Payer: Self-pay | Admitting: Family Medicine

## 2018-01-15 ENCOUNTER — Other Ambulatory Visit: Payer: Self-pay | Admitting: Family Medicine

## 2018-01-15 NOTE — Telephone Encounter (Signed)
I called and spoke with pharmacist who verified that patient did fill the prescription for Valsartan. Pharmacist states that the request for Lisinopril must have been sent by accident through the auto refill request. Please discard this request.

## 2018-01-15 NOTE — Telephone Encounter (Signed)
This was changed to valsartan on 12-27-2017. Did patient fill prescription for valsartan?

## 2018-01-29 ENCOUNTER — Ambulatory Visit (INDEPENDENT_AMBULATORY_CARE_PROVIDER_SITE_OTHER): Payer: BLUE CROSS/BLUE SHIELD | Admitting: Family Medicine

## 2018-01-29 ENCOUNTER — Encounter: Payer: Self-pay | Admitting: Family Medicine

## 2018-01-29 VITALS — BP 130/64 | HR 68 | Temp 98.0°F | Resp 16 | Ht 70.0 in | Wt 192.0 lb

## 2018-01-29 DIAGNOSIS — E890 Postprocedural hypothyroidism: Secondary | ICD-10-CM

## 2018-01-29 DIAGNOSIS — E782 Mixed hyperlipidemia: Secondary | ICD-10-CM

## 2018-01-29 DIAGNOSIS — Z125 Encounter for screening for malignant neoplasm of prostate: Secondary | ICD-10-CM

## 2018-01-29 DIAGNOSIS — I2510 Atherosclerotic heart disease of native coronary artery without angina pectoris: Secondary | ICD-10-CM | POA: Diagnosis not present

## 2018-01-29 DIAGNOSIS — I10 Essential (primary) hypertension: Secondary | ICD-10-CM | POA: Diagnosis not present

## 2018-01-29 DIAGNOSIS — Z23 Encounter for immunization: Secondary | ICD-10-CM

## 2018-01-29 DIAGNOSIS — Z0000 Encounter for general adult medical examination without abnormal findings: Secondary | ICD-10-CM

## 2018-01-29 NOTE — Progress Notes (Signed)
Patient: John Liu, Male    DOB: Sep 17, 1945, 72 y.o.   MRN: [id number] Visit Date: 01/29/2018  Today's Provider: Lelon Huh, MD  Chief Complaint  Patient presents with  . Annual Exam  . Hypertension  . Hyperlipidemia  . Coronary Artery Disease  . Hypothyroidism   Subjective:    Annual physical John Liu is a 72 y.o. male. He feels well. He reports exercising some. He reports he is sleeping fairly well.  -----------------------------------------------------------   Hypertension, follow-up:  BP Readings from Last 3 Encounters:  01/29/18 130/64  12/27/17 (!) 112/54  11/13/16 130/74    He was last seen for hypertension 1 months ago.  BP at that visit was 112/54. Management since that visit includes; changed to valsartan .He reports good compliance with treatment. He is not having side effects. none He is exercising. He is adherent to low salt diet.   Outside blood pressures are not checking. He is experiencing none.  Patient denies none.   Cardiovascular risk factors include advanced age (older than 91 for men, 75 for women).  Use of agents associated with hypertension: none.   ---------------------------------------------------------------    Lipid/Cholesterol, Follow-up:   Last seen for this 1 months ago.  Management since that visit includes; will reassess at CPE.   Last Lipid Panel:    Component Value Date/Time   CHOL 248 (H) 03/14/2016 0842   TRIG 291 (H) 03/14/2016 0842   HDL 39 (L) 03/14/2016 0842   CHOLHDL 6.4 (H) 03/14/2016 0842   LDLCALC 151 (H) 03/14/2016 4970    He reports good compliance with treatment. He is not having side effects. none  Wt Readings from Last 3 Encounters:  01/29/18 192 lb (87.1 kg)  12/27/17 193 lb (87.5 kg)  11/13/16 194 lb (88 kg)    ---------------------------------------------------------------  Hypothyroidism, postop From 02/28/2016-labs checked, no changes.   Coronary artery disease  involving native coronary artery of native heart without angina pectoris From 02/28/2016-no changes. Denies having any chest pains or dyspnea.    Review of Systems  Constitutional: Positive for fatigue.  HENT: Positive for hearing loss, sinus pressure and voice change.   Eyes: Positive for photophobia and itching.  Respiratory: Positive for shortness of breath.     Social History   Socioeconomic History  . Marital status: Married    Spouse name: Not on file  . Number of children: Not on file  . Years of education: Not on file  . Highest education level: Not on file  Occupational History  . Not on file  Social Needs  . Financial resource strain: Not on file  . Food insecurity:    Worry: Not on file    Inability: Not on file  . Transportation needs:    Medical: Not on file    Non-medical: Not on file  Tobacco Use  . Smoking status: Former Smoker    Packs/day: 0.75    Years: 30.00    Pack years: 22.50    Types: Cigarettes    Last attempt to quit: 05/16/2010    Years since quitting: 7.7  . Smokeless tobacco: Former Network engineer and Sexual Activity  . Alcohol use: Yes    Comment: Drinks beer; Moderate use  . Drug use: No  . Sexual activity: Not on file  Lifestyle  . Physical activity:    Days per week: Not on file    Minutes per session: Not on file  . Stress: Not on file  Relationships  . Social connections:    Talks on phone: Not on file    Gets together: Not on file    Attends religious service: Not on file    Active member of club or organization: Not on file    Attends meetings of clubs or organizations: Not on file    Relationship status: Not on file  . Intimate partner violence:    Fear of current or ex partner: Not on file    Emotionally abused: Not on file    Physically abused: Not on file    Forced sexual activity: Not on file  Other Topics Concern  . Not on file  Social History Narrative  . Not on file    Past Medical History:  Diagnosis  Date  . CAD (coronary artery disease)   . History of thyroid cancer   . Hyperglycemia   . Hyperlipidemia   . Hypertension   . Thyroid disease      Patient Active Problem List   Diagnosis Date Noted  . CAD (coronary artery disease) 02/28/2016  . Depression 02/28/2016  . Personal history of malignant neoplasm of thyroid 12/24/2015  . Family history of cardiovascular disease 12/19/2008  . History of tobacco use 12/17/2008  . Hyperlipidemia, mixed 07/17/2007  . Hypothyroidism, postop 07/16/2004  . Essential (primary) hypertension 09/19/1999      His family history includes Breast cancer in his mother; Cancer in his sister; Lung cancer in his brother.      Current Outpatient Medications:  .  aspirin 81 MG tablet, Take 1 tablet by mouth daily., Disp: , Rfl:  .  fluticasone (FLONASE) 50 MCG/ACT nasal spray, Place 2 sprays into both nostrils daily., Disp: 16 g, Rfl: 6 .  levothyroxine (SYNTHROID, LEVOTHROID) 100 MCG tablet, TAKE 1 TABLET BY MOUTH EVERY DAY, Disp: 30 tablet, Rfl: 12 .  pravastatin (PRAVACHOL) 40 MG tablet, Take 1 tablet (40 mg total) by mouth daily., Disp: 90 tablet, Rfl: 3 .  triamterene-hydrochlorothiazide (MAXZIDE-25) 37.5-25 MG tablet, TAKE 1 TABLET BY MOUTH DAILY., Disp: 30 tablet, Rfl: 5 .  valsartan (DIOVAN) 160 MG tablet, Take 1 tablet (160 mg total) by mouth daily., Disp: 90 tablet, Rfl: 2  Patient Care Team: Birdie Sons, MD as PCP - General (Family Medicine) Corey Skains, MD as Consulting Physician (Cardiology)     Objective:   Vitals: BP 130/64 (BP Location: Right Arm, Patient Position: Sitting, Cuff Size: Large)   Pulse 68   Temp 98 F (36.7 C) (Oral)   Resp 16   Ht 5\' 10"  (1.778 m)   Wt 192 lb (87.1 kg)   SpO2 98%   BMI 27.55 kg/m   Physical Exam   General Appearance:    Alert, cooperative, no distress, appears stated age  Head:    Normocephalic, without obvious abnormality, atraumatic  Eyes:    PERRL, conjunctiva/corneas clear,  EOM's intact, fundi    benign, both eyes       Ears:    Normal TM's and external ear canals, both ears  Nose:   Nares normal, septum midline, mucosa normal, no drainage   or sinus tenderness  Throat:   Lips, mucosa, and tongue normal; teeth and gums normal  Neck:   Supple, symmetrical, trachea midline, no adenopathy;       thyroid:  No enlargement/tenderness/nodules; no carotid   bruit or JVD  Back:     Symmetric, no curvature, ROM normal, no CVA tenderness  Lungs:     Clear  to auscultation bilaterally, respirations unlabored  Chest wall:    No tenderness or deformity  Heart:    Regular rate and rhythm, S1 and S2 normal, no murmur, rub   or gallop  Abdomen:     Soft, non-tender, bowel sounds active all four quadrants,    no masses, no organomegaly  Genitalia:    deferred  Rectal:    deferred  Extremities:   Extremities normal, atraumatic, no cyanosis or edema  Pulses:   2+ and symmetric all extremities  Skin:   Skin color, texture, turgor normal, no rashes or lesions  Lymph nodes:   Cervical, supraclavicular, and axillary nodes normal  Neurologic:   CNII-XII intact. Normal strength, sensation and reflexes      throughout    Activities of Daily Living In your present state of health, do you have any difficulty performing the following activities: 01/29/2018  Hearing? Y  Vision? Y  Difficulty concentrating or making decisions? N  Walking or climbing stairs? N  Dressing or bathing? N  Doing errands, shopping? N  Some recent data might be hidden    Fall Risk Assessment Fall Risk  01/29/2018  Falls in the past year? No     Depression Screen PHQ 2/9 Scores 01/29/2018  PHQ - 2 Score 0  PHQ- 9 Score 0    Cognitive Testing - 6-CIT  Correct? Score   What year is it? yes 0 0 or 4  What month is it? yes 0 0 or 3  Memorize:    Pia Mau,  44,  [address],  [place],      What time is it? (within 1 hour) yes 0 0 or 3  Count backwards from 20 yes 0 0, 2, or 4  Name the months of  the year yes 0 0, 2, or 4  Repeat name & address above yes 3 0, 2, 4, 6, 8, or 10       TOTAL SCORE  3/28   Interpretation:  Normal  Normal (0-7) Abnormal (8-28)    Audit-C Alcohol Use Screening   Alcohol Use Disorder Test (AUDIT) 01/29/2018  1. How often do you have a drink containing alcohol? 4  2. How many drinks containing alcohol do you have on a typical day when you are drinking? 1  3. How often do you have six or more drinks on one occasion? 1  AUDIT-C Score 6    A score of 3 or more in women, and 4 or more in men indicates increased risk for alcohol abuse, EXCEPT if all of the points are from question 1    Assessment & Plan:    Annual physical Reviewed patient's Family Medical History Reviewed and updated list of patient's medical providers Assessment of cognitive impairment was done Assessed patient's functional ability Established a written schedule for health screening [place] Completed and Reviewed  Exercise Activities and Dietary recommendations Goals    None       There is no immunization history on file for this patient.  Health Maintenance  Topic Date Due  . Hepatitis C Screening  23-Oct-1945  . TETANUS/TDAP  07/07/1967  . COLONOSCOPY  07/06/1998  . PNA vac Low Risk Adult (1 of 2 - PCV13) 07/06/2013  . INFLUENZA VACCINE  02/13/2018     Discussed health benefits of physical activity, and encouraged him to engage in regular exercise appropriate for his age and condition.     - TSH - Comprehensive metabolic panel -  Lipid panel - PSA - EKG 12-Lead  2. Coronary artery disease involving native coronary artery of native heart without angina pectoris Asymptomatic. Compliant with medication.  Continue aggressive risk factor modification.   - EKG 12-Lead  3. Essential (primary) hypertension Well controlled.  Continue current medications.   - EKG 12-Lead  4. Hyperlipidemia, mixed He is tolerating pravastatin well with no  adverse effects.   - Comprehensive metabolic panel - Lipid panel  5. Hypothyroidism, postop  - TSH  6. Prostate cancer screening  - PSA   Lelon Huh, MD  [facility]

## 2018-01-29 NOTE — Patient Instructions (Signed)
Check with insurance plan regarding coverage for cologuard

## 2018-02-03 ENCOUNTER — Ambulatory Visit: Payer: BLUE CROSS/BLUE SHIELD | Admitting: Family Medicine

## 2018-02-03 ENCOUNTER — Encounter: Payer: Self-pay | Admitting: Family Medicine

## 2018-02-03 VITALS — BP 124/60 | HR 54 | Temp 98.6°F | Resp 18 | Wt 192.0 lb

## 2018-02-03 DIAGNOSIS — M109 Gout, unspecified: Secondary | ICD-10-CM

## 2018-02-03 MED ORDER — COLCHICINE 0.6 MG PO TABS: ORAL_TABLET | ORAL | 1 refills | Status: DC

## 2018-02-03 NOTE — Patient Instructions (Signed)

## 2018-02-03 NOTE — Progress Notes (Signed)
Patient: John Liu Male    DOB: [birth year]   72 y.o.   MRN: [id number] Visit Date: 02/03/2018  Today's Provider: Lelon Huh, MD   Chief Complaint  Patient presents with  . Ankle Pain   Subjective:    Ankle Pain   Incident onset: 4 days ago. The pain is present in the right ankle. The quality of the pain is described as stabbing. The pain is moderate. The pain has been constant since onset. Associated symptoms include an inability to bear weight. Pertinent negatives include no numbness or tingling. Associated symptoms comments: Edema. The symptoms are aggravated by weight bearing. He has tried elevation for the symptoms. The treatment provided mild relief.  Patient states the swelling and pain in his right ankle has worsened to the point where it is affecting his gait.  He does have history of gout in his toe which flares once or twice a year.     No Known Allergies   Current Outpatient Medications:  .  aspirin 81 MG tablet, Take 1 tablet by mouth daily., Disp: , Rfl:  .  fluticasone (FLONASE) 50 MCG/ACT nasal spray, Place 2 sprays into both nostrils daily., Disp: 16 g, Rfl: 6 .  levothyroxine (SYNTHROID, LEVOTHROID) 100 MCG tablet, TAKE 1 TABLET BY MOUTH EVERY DAY, Disp: 30 tablet, Rfl: 12 .  pravastatin (PRAVACHOL) 40 MG tablet, Take 1 tablet (40 mg total) by mouth daily., Disp: 90 tablet, Rfl: 3 .  triamterene-hydrochlorothiazide (MAXZIDE-25) 37.5-25 MG tablet, TAKE 1 TABLET BY MOUTH DAILY., Disp: 30 tablet, Rfl: 5 .  valsartan (DIOVAN) 160 MG tablet, Take 1 tablet (160 mg total) by mouth daily., Disp: 90 tablet, Rfl: 2  Review of Systems  Constitutional: Negative for appetite change, chills and fever.  Respiratory: Negative for chest tightness, shortness of breath and wheezing.   Cardiovascular: Negative for chest pain and palpitations.  Gastrointestinal: Negative for abdominal pain, nausea and vomiting.  Musculoskeletal: Positive for arthralgias (right ankle )  and joint swelling (right ankle).  Neurological: Negative for tingling and numbness.    Social History   Tobacco Use  . Smoking status: Former Smoker    Packs/day: 0.75    Years: 30.00    Pack years: 22.50    Types: Cigarettes    Last attempt to quit: 05/16/2010    Years since quitting: 7.7  . Smokeless tobacco: Former Network engineer Use Topics  . Alcohol use: Yes    Comment: Drinks beer; Moderate use   Objective:   BP 124/60 (BP Location: Left Arm, Patient Position: Sitting, Cuff Size: Large)   Pulse (!) 54   Temp 98.6 F (37 C) (Oral)   Resp 18   Wt 192 lb (87.1 kg)   SpO2 99% Comment: room air  BMI 27.55 kg/m  Vitals:   02/03/18 1450  BP: 124/60  Pulse: (!) 54  Resp: 18  Temp: 98.6 F (37 C)  TempSrc: Oral  SpO2: 99%  Weight: 192 lb (87.1 kg)     Physical Exam  General appearance: alert, well developed, well nourished, cooperative and in no distress Head: Normocephalic, without obvious abnormality, atraumatic Respiratory: Respirations even and unlabored, normal respiratory rate Extremities: right medial ankle moderate swollen and tender, slightly warm to touch.      Assessment & Plan:     1. Acute gout of right ankle, unspecified cause  - colchicine 0.6 MG tablet; Two tablets first day, then one tablet daily until gout is gone  Dispense: 30 tablet; Refill: 1  Call if symptoms change or if not rapidly improving.          Lelon Huh, MD  [facility]

## 2018-02-04 DIAGNOSIS — E890 Postprocedural hypothyroidism: Secondary | ICD-10-CM | POA: Diagnosis not present

## 2018-02-04 DIAGNOSIS — Z0000 Encounter for general adult medical examination without abnormal findings: Secondary | ICD-10-CM | POA: Diagnosis not present

## 2018-02-04 DIAGNOSIS — Z125 Encounter for screening for malignant neoplasm of prostate: Secondary | ICD-10-CM | POA: Diagnosis not present

## 2018-02-04 DIAGNOSIS — E782 Mixed hyperlipidemia: Secondary | ICD-10-CM | POA: Diagnosis not present

## 2018-02-05 ENCOUNTER — Telehealth: Payer: Self-pay

## 2018-02-05 LAB — LIPID PANEL
CHOLESTEROL TOTAL: 220 mg/dL — AB (ref 100–199)
Chol/HDL Ratio: 4.8 ratio (ref 0.0–5.0)
HDL: 46 mg/dL (ref >39)
LDL CALC: 156 mg/dL — AB (ref 0–99)
Triglycerides: 89 mg/dL (ref 0–149)
VLDL Cholesterol Cal: 18 mg/dL (ref 5–40)

## 2018-02-05 LAB — COMPREHENSIVE METABOLIC PANEL
ALBUMIN: 4 g/dL (ref 3.6–4.8)
ALK PHOS: 84 IU/L (ref 39–117)
ALT: 22 IU/L (ref 0–44)
AST: 21 IU/L (ref 0–40)
Albumin/Globulin Ratio: 1.6 (ref 1.2–2.2)
BILIRUBIN TOTAL: 0.5 mg/dL (ref 0.0–1.2)
BUN / CREAT RATIO: 11 (ref 10–24)
BUN: 14 mg/dL (ref 8–27)
CHLORIDE: 101 mmol/L (ref 96–106)
CO2: 22 mmol/L (ref 20–29)
CREATININE: 1.26 mg/dL (ref 0.76–1.27)
Calcium: 8.7 mg/dL (ref 8.6–10.2)
GFR calc Af Amer: 67 mL/min/{1.73_m2} (ref >59)
GFR calc non Af Amer: 58 mL/min/{1.73_m2} — ABNORMAL LOW (ref >59)
GLOBULIN, TOTAL: 2.5 g/dL (ref 1.5–4.5)
Glucose: 105 mg/dL — ABNORMAL HIGH (ref 65–99)
Potassium: 4.2 mmol/L (ref 3.5–5.2)
SODIUM: 139 mmol/L (ref 134–144)
TOTAL PROTEIN: 6.5 g/dL (ref 6.0–8.5)

## 2018-02-05 LAB — PSA: Prostate Specific Ag, Serum: 2.3 ng/mL (ref 0.0–4.0)

## 2018-02-05 LAB — TSH: TSH: 1.28 u[IU]/mL (ref 0.450–4.500)

## 2018-02-05 MED ORDER — ROSUVASTATIN CALCIUM 20 MG PO TABS: 20.0000 mg | ORAL_TABLET | Freq: Every day | ORAL | 5 refills | Status: DC

## 2018-02-05 NOTE — Telephone Encounter (Signed)
-----   Message from Birdie Sons, MD sent at 02/05/2018 12:20 PM EDT ----- LDL cholesterol is much too high at 156, needs to be under 70 due to his heart disease. Need to start rosuvastatin 20mg  once daily, #30, rf x 5. Rest of labs are normal. Follow up re medications 4 months.

## 2018-02-05 NOTE — Telephone Encounter (Signed)
Patient advised. RX sent to [facility]. 4 month follow up scheduled.

## 2018-02-25 ENCOUNTER — Other Ambulatory Visit: Payer: Self-pay | Admitting: Family Medicine

## 2018-02-25 DIAGNOSIS — M109 Gout, unspecified: Secondary | ICD-10-CM

## 2018-02-27 DIAGNOSIS — H401131 Primary open-angle glaucoma, bilateral, mild stage: Secondary | ICD-10-CM | POA: Diagnosis not present

## 2018-02-27 DIAGNOSIS — H2513 Age-related nuclear cataract, bilateral: Secondary | ICD-10-CM | POA: Diagnosis not present

## 2018-03-30 ENCOUNTER — Other Ambulatory Visit: Payer: Self-pay | Admitting: Family Medicine

## 2018-04-21 ENCOUNTER — Other Ambulatory Visit: Payer: Self-pay | Admitting: Family Medicine

## 2018-05-27 DIAGNOSIS — H2513 Age-related nuclear cataract, bilateral: Secondary | ICD-10-CM | POA: Diagnosis not present

## 2018-05-27 DIAGNOSIS — H401131 Primary open-angle glaucoma, bilateral, mild stage: Secondary | ICD-10-CM | POA: Diagnosis not present

## 2018-06-04 ENCOUNTER — Encounter: Payer: Self-pay | Admitting: Family Medicine

## 2018-06-04 ENCOUNTER — Ambulatory Visit: Payer: BLUE CROSS/BLUE SHIELD | Admitting: Family Medicine

## 2018-06-04 VITALS — BP 138/76 | HR 52 | Temp 98.7°F | Resp 16 | Ht 70.0 in | Wt 195.0 lb

## 2018-06-04 DIAGNOSIS — Z6827 Body mass index (BMI) 27.0-27.9, adult: Secondary | ICD-10-CM

## 2018-06-04 DIAGNOSIS — L989 Disorder of the skin and subcutaneous tissue, unspecified: Secondary | ICD-10-CM

## 2018-06-04 DIAGNOSIS — E782 Mixed hyperlipidemia: Secondary | ICD-10-CM

## 2018-06-04 DIAGNOSIS — E890 Postprocedural hypothyroidism: Secondary | ICD-10-CM | POA: Diagnosis not present

## 2018-06-04 DIAGNOSIS — Z23 Encounter for immunization: Secondary | ICD-10-CM

## 2018-06-04 DIAGNOSIS — I2510 Atherosclerotic heart disease of native coronary artery without angina pectoris: Secondary | ICD-10-CM

## 2018-06-04 NOTE — Progress Notes (Signed)
Patient: John Liu Male    DOB: 06/11/1946   72 y.o.   MRN: [id number] Visit Date: 06/04/2018  Today's Provider: Lelon Huh, MD   Chief Complaint  Patient presents with  . Hyperlipidemia  . Hypertension  . Hypothyroidism  . Coronary Artery Disease   Subjective:    HPI  Lipid/Cholesterol, Follow-up:   Last seen for this 4 months ago.  Management changes since that visit include starting Rosuvastatin 20mg  daily. . Last Lipid Panel:    Component Value Date/Time   CHOL 220 (H) 02/04/2018 0907   TRIG 89 02/04/2018 0907   HDL 46 02/04/2018 0907   CHOLHDL 4.8 02/04/2018 0907   LDLCALC 156 (H) 02/04/2018 0907    Risk factors for vascular disease include hypercholesterolemia and hypertension  He reports good compliance with treatment. He is not having side effects.  Current symptoms include none and have been stable. Weight trend: fluctuating a bit Prior visit with dietician: no Current diet: well balanced Current exercise: walking  Wt Readings from Last 3 Encounters:  06/04/18 195 lb (88.5 kg)  02/03/18 192 lb (87.1 kg)  01/29/18 192 lb (87.1 kg)    -------------------------------------------------------------------  Hypertension, follow-up:  BP Readings from Last 3 Encounters:  06/04/18 138/76  02/03/18 124/60  01/29/18 130/64    He was last seen for hypertension 4 months ago.  BP at that visit was 130/64. Management since that visit includes no changes. He reports good compliance with treatment. He is not having side effects.  He is exercising. He is not adherent to low salt diet.   Outside blood pressures are not being checked. He is experiencing none.  Patient denies chest pain, chest pressure/discomfort, claudication, dyspnea, exertional chest pressure/discomfort, fatigue, irregular heart beat, lower extremity edema, near-syncope, orthopnea, palpitations, paroxysmal nocturnal dyspnea, syncope and tachypnea.   Cardiovascular risk  factors include advanced age (older than 35 for men, 57 for women), dyslipidemia, hypertension and male gender.  Use of agents associated with hypertension: NSAIDS.     Weight trend: fluctuating a bit Wt Readings from Last 3 Encounters:  06/04/18 195 lb (88.5 kg)  02/03/18 192 lb (87.1 kg)  01/29/18 192 lb (87.1 kg)    Current diet: well balanced  ------------------------------------------------------------------------ Hypothyroidism: Patient was last seen for this problem 4 months ago and no changes were made.  Lab Results  Component Value Date   TSH 1.280 02/04/2018     CAD: Patient was last seen for this problem 4 months ago. Management durring that visit includes starting Rosuvastatin. He is taking consistently and tolerating well. Denies any chest pains or heart flutters.  He also reports mole on his neck that has been there for years seems to be getting larger and darker.      No Known Allergies   Current Outpatient Medications:  .  aspirin 81 MG tablet, Take 1 tablet by mouth daily., Disp: , Rfl:  .  fluticasone (FLONASE) 50 MCG/ACT nasal spray, Place 2 sprays into both nostrils daily., Disp: 16 g, Rfl: 6 .  levothyroxine (SYNTHROID, LEVOTHROID) 100 MCG tablet, TAKE 1 TABLET BY MOUTH EVERY DAY, Disp: 90 tablet, Rfl: 4 .  pravastatin (PRAVACHOL) 40 MG tablet, Take 1 tablet (40 mg total) by mouth daily., Disp: 90 tablet, Rfl: 3 .  rosuvastatin (CRESTOR) 20 MG tablet, Take 1 tablet (20 mg total) by mouth daily., Disp: 30 tablet, Rfl: 5 .  triamterene-hydrochlorothiazide (MAXZIDE-25) 37.5-25 MG tablet, TAKE 1 TABLET BY MOUTH DAILY., Disp: 90  tablet, Rfl: 4 .  valsartan (DIOVAN) 160 MG tablet, Take 1 tablet (160 mg total) by mouth daily., Disp: 90 tablet, Rfl: 2 .  colchicine 0.6 MG tablet, TWO TABLETS FIRST DAY, THEN ONE TABLET DAILY UNTIL GOUT IS GONE (Patient not taking: Reported on 06/04/2018), Disp: 30 tablet, Rfl: 3  Review of Systems  Constitutional: Negative for  appetite change, chills and fever.  Respiratory: Negative for chest tightness, shortness of breath and wheezing.   Cardiovascular: Negative for chest pain and palpitations.  Gastrointestinal: Negative for abdominal pain, nausea and vomiting.    Social History   Tobacco Use  . Smoking status: Former Smoker    Packs/day: 0.75    Years: 30.00    Pack years: 22.50    Types: Cigarettes    Last attempt to quit: 05/16/2010    Years since quitting: 8.0  . Smokeless tobacco: Former Network engineer Use Topics  . Alcohol use: Yes    Comment: Drinks beer; Moderate use   Objective:   BP 138/76 (BP Location: Left Arm, Patient Position: Sitting, Cuff Size: Large)   Pulse (!) 52   Temp 98.7 F (37.1 C) (Oral)   Resp 16   Ht 5\' 10"  (1.778 m)   Wt 195 lb (88.5 kg)   SpO2 95% Comment: room air  BMI 27.98 kg/m  Vitals:   06/04/18 1606  BP: 138/76  Pulse: (!) 52  Resp: 16  Temp: 98.7 F (37.1 C)  TempSrc: Oral  SpO2: 95%  Weight: 195 lb (88.5 kg)  Height: 5\' 10"  (1.778 m)     Physical Exam   General Appearance:    Alert, cooperative, no distress  Eyes:    PERRL, conjunctiva/corneas clear, EOM's intact       Lungs:     Clear to auscultation bilaterally, respirations unlabored  Heart:    Regular rate and rhythm  Neurologic:   Awake, alert, oriented x 3. No apparent focal neurological           defect.   Derm:   Irregularly pigmented lesion of neck.        Assessment & Plan:     1. Coronary artery disease involving native coronary artery of native heart without angina pectoris Asymptomatic. Compliant with medication.  Continue aggressive risk factor modification.   2. Hyperlipidemia, mixed He is tolerating rosuvastatin well with no adverse effects.   - Lipid panel - Comprehensive metabolic panel  3. Hypothyroidism, postop Doing well current thyroid replacment - TSH  4. Skin lesion of neck  - Ambulatory referral to Dermatology  5. BMI 27.0-27.9,adult   6. Need for  influenza vaccination  - Flu vaccine HIGH DOSE PF (Fluzone High dose)'      Lelon Huh, MD  [facility]

## 2018-06-04 NOTE — Patient Instructions (Signed)
.   Please go to the lab draw station in [address] on the second floor of [facility] when you are fasting

## 2018-06-22 ENCOUNTER — Other Ambulatory Visit: Payer: Self-pay | Admitting: Family Medicine

## 2018-06-22 DIAGNOSIS — M109 Gout, unspecified: Secondary | ICD-10-CM

## 2018-07-07 DIAGNOSIS — E782 Mixed hyperlipidemia: Secondary | ICD-10-CM | POA: Diagnosis not present

## 2018-07-07 DIAGNOSIS — E890 Postprocedural hypothyroidism: Secondary | ICD-10-CM | POA: Diagnosis not present

## 2018-07-08 ENCOUNTER — Telehealth: Payer: Self-pay

## 2018-07-08 LAB — COMPREHENSIVE METABOLIC PANEL
A/G RATIO: 1.7 (ref 1.2–2.2)
ALBUMIN: 4.3 g/dL (ref 3.5–4.8)
ALT: 43 IU/L (ref 0–44)
AST: 37 IU/L (ref 0–40)
Alkaline Phosphatase: 97 IU/L (ref 39–117)
BUN / CREAT RATIO: 12 (ref 10–24)
BUN: 16 mg/dL (ref 8–27)
Bilirubin Total: 0.5 mg/dL (ref 0.0–1.2)
CHLORIDE: 100 mmol/L (ref 96–106)
CO2: 24 mmol/L (ref 20–29)
Calcium: 9.2 mg/dL (ref 8.6–10.2)
Creatinine, Ser: 1.33 mg/dL — ABNORMAL HIGH (ref 0.76–1.27)
GFR calc non Af Amer: 54 mL/min/{1.73_m2} — ABNORMAL LOW (ref >59)
GFR, EST AFRICAN AMERICAN: 62 mL/min/{1.73_m2} (ref >59)
GLOBULIN, TOTAL: 2.5 g/dL (ref 1.5–4.5)
Glucose: 93 mg/dL (ref 65–99)
POTASSIUM: 4.2 mmol/L (ref 3.5–5.2)
SODIUM: 141 mmol/L (ref 134–144)
TOTAL PROTEIN: 6.8 g/dL (ref 6.0–8.5)

## 2018-07-08 LAB — TSH: TSH: 1.3 u[IU]/mL (ref 0.450–4.500)

## 2018-07-08 LAB — LIPID PANEL
Chol/HDL Ratio: 3.3 ratio (ref 0.0–5.0)
Cholesterol, Total: 167 mg/dL (ref 100–199)
HDL: 51 mg/dL (ref >39)
LDL Calculated: 87 mg/dL (ref 0–99)
TRIGLYCERIDES: 143 mg/dL (ref 0–149)
VLDL Cholesterol Cal: 29 mg/dL (ref 5–40)

## 2018-07-08 NOTE — Telephone Encounter (Signed)
Tried calling; no answer.   Thanks,   -Sonita Michiels  

## 2018-07-08 NOTE — Telephone Encounter (Signed)
-----   Message from Birdie Sons, MD sent at 07/08/2018  7:47 AM EST ----- Cholesterol is better, down from 220 to 167. Goal is to get it under 150. Thyroid functions are good. Continue current dose of rosuvastatin. Follow up for yearly CPE in July.

## 2018-07-10 NOTE — Telephone Encounter (Signed)
Tried to call patient no answer and was not able to leave a message.

## 2018-07-14 ENCOUNTER — Telehealth: Payer: Self-pay | Admitting: Family Medicine

## 2018-07-14 NOTE — Telephone Encounter (Signed)
Patient was advised and will cal back to schedule his July appointment.

## 2018-07-14 NOTE — Telephone Encounter (Signed)
Patient was advised.  

## 2018-07-14 NOTE — Telephone Encounter (Signed)
Pt returned missed call.  Please call pt back.  Please leave a message since he cannot answer his phone at during work hours.  Thanks, [employer]

## 2018-07-14 NOTE — Telephone Encounter (Signed)
na

## 2018-07-18 ENCOUNTER — Other Ambulatory Visit: Payer: Self-pay | Admitting: Family Medicine

## 2018-07-18 DIAGNOSIS — I10 Essential (primary) hypertension: Secondary | ICD-10-CM

## 2018-07-20 ENCOUNTER — Other Ambulatory Visit: Payer: Self-pay | Admitting: Family Medicine

## 2018-07-20 DIAGNOSIS — J329 Chronic sinusitis, unspecified: Secondary | ICD-10-CM

## 2018-07-30 ENCOUNTER — Other Ambulatory Visit: Payer: Self-pay | Admitting: Family Medicine

## 2018-07-30 DIAGNOSIS — L821 Other seborrheic keratosis: Secondary | ICD-10-CM | POA: Diagnosis not present

## 2018-07-30 DIAGNOSIS — L820 Inflamed seborrheic keratosis: Secondary | ICD-10-CM | POA: Diagnosis not present

## 2018-08-28 DIAGNOSIS — H2513 Age-related nuclear cataract, bilateral: Secondary | ICD-10-CM | POA: Diagnosis not present

## 2018-08-28 DIAGNOSIS — H401131 Primary open-angle glaucoma, bilateral, mild stage: Secondary | ICD-10-CM | POA: Diagnosis not present

## 2018-09-24 ENCOUNTER — Other Ambulatory Visit: Payer: Self-pay | Admitting: Family Medicine

## 2018-09-24 DIAGNOSIS — I10 Essential (primary) hypertension: Secondary | ICD-10-CM

## 2018-09-24 MED ORDER — TELMISARTAN 40 MG PO TABS: 40.0000 mg | ORAL_TABLET | Freq: Every day | ORAL | 3 refills | Status: DC

## 2018-12-14 ENCOUNTER — Other Ambulatory Visit: Payer: Self-pay | Admitting: Family Medicine

## 2018-12-14 DIAGNOSIS — M109 Gout, unspecified: Secondary | ICD-10-CM

## 2018-12-19 DIAGNOSIS — H10233 Serous conjunctivitis, except viral, bilateral: Secondary | ICD-10-CM | POA: Diagnosis not present

## 2018-12-19 DIAGNOSIS — H2513 Age-related nuclear cataract, bilateral: Secondary | ICD-10-CM | POA: Diagnosis not present

## 2018-12-19 DIAGNOSIS — H401131 Primary open-angle glaucoma, bilateral, mild stage: Secondary | ICD-10-CM | POA: Diagnosis not present

## 2019-03-27 DIAGNOSIS — H401131 Primary open-angle glaucoma, bilateral, mild stage: Secondary | ICD-10-CM | POA: Diagnosis not present

## 2019-03-27 DIAGNOSIS — H2513 Age-related nuclear cataract, bilateral: Secondary | ICD-10-CM | POA: Diagnosis not present

## 2019-04-09 ENCOUNTER — Other Ambulatory Visit: Payer: Self-pay | Admitting: Family Medicine

## 2019-04-09 DIAGNOSIS — J329 Chronic sinusitis, unspecified: Secondary | ICD-10-CM

## 2019-04-24 DIAGNOSIS — H2513 Age-related nuclear cataract, bilateral: Secondary | ICD-10-CM | POA: Diagnosis not present

## 2019-04-24 DIAGNOSIS — H401131 Primary open-angle glaucoma, bilateral, mild stage: Secondary | ICD-10-CM | POA: Diagnosis not present

## 2019-06-26 ENCOUNTER — Other Ambulatory Visit: Payer: Self-pay | Admitting: Family Medicine

## 2019-07-02 ENCOUNTER — Other Ambulatory Visit: Payer: Self-pay | Admitting: Family Medicine

## 2019-07-02 NOTE — Telephone Encounter (Signed)
Requested medication (s) are due for refill today: yes  Requested medication (s) are on the active medication list: yes  Last refill:  04/07/2019  Future visit scheduled: no  Notes to clinic:  no valid encounter in last 6 months   Requested Prescriptions  Pending Prescriptions Disp Refills   triamterene-hydrochlorothiazide (MAXZIDE-25) 37.5-25 MG tablet [Pharmacy Med Name: TRIAMTERENE-HCTZ 37.5-25 MG TB] 90 tablet 4    Sig: TAKE 1 TABLET BY MOUTH DAILY.      Cardiovascular: Diuretic Combos Failed - 07/02/2019  3:52 AM      Failed - Cr in normal range and within 360 days    Creatinine, Ser  Date Value Ref Range Status  07/07/2018 1.33 (H) 0.76 - 1.27 mg/dL Final          Failed - Valid encounter within last 6 months    Recent Outpatient Visits           1 year ago Coronary artery disease involving native coronary artery of native heart without angina pectoris   [facility] Birdie Sons, MD   1 year ago Acute gout of right ankle, unspecified cause   [facility] Birdie Sons, MD   1 year ago Annual physical exam   [facility] Birdie Sons, MD   1 year ago Sinusitis, unspecified chronicity, unspecified location   [facility] Birdie Sons, MD   2 years ago Acute right-sided low back pain without sciatica   [facility] Birdie Sons, MD              Passed - K in normal range and within 360 days    Potassium  Date Value Ref Range Status  07/07/2018 4.2 3.5 - 5.2 mmol/L Final          Passed - Na in normal range and within 360 days    Sodium  Date Value Ref Range Status  07/07/2018 141 134 - 144 mmol/L Final          Passed - Ca in normal range and within 360 days    Calcium  Date Value Ref Range Status  07/07/2018 9.2 8.6 - 10.2 mg/dL Final          Passed - Last BP in normal range    BP Readings from Last 1 Encounters:  06/04/18 138/76

## 2019-07-24 DIAGNOSIS — H2513 Age-related nuclear cataract, bilateral: Secondary | ICD-10-CM | POA: Diagnosis not present

## 2019-07-24 DIAGNOSIS — H401131 Primary open-angle glaucoma, bilateral, mild stage: Secondary | ICD-10-CM | POA: Diagnosis not present

## 2019-07-27 ENCOUNTER — Other Ambulatory Visit: Payer: Self-pay | Admitting: Family Medicine

## 2019-09-07 ENCOUNTER — Other Ambulatory Visit: Payer: Self-pay | Admitting: Family Medicine

## 2019-09-07 DIAGNOSIS — H2513 Age-related nuclear cataract, bilateral: Secondary | ICD-10-CM | POA: Diagnosis not present

## 2019-09-07 DIAGNOSIS — M109 Gout, unspecified: Secondary | ICD-10-CM

## 2019-09-07 DIAGNOSIS — H401131 Primary open-angle glaucoma, bilateral, mild stage: Secondary | ICD-10-CM | POA: Diagnosis not present

## 2019-09-07 NOTE — Telephone Encounter (Signed)
Requested medication (s) are due for refill today: yes  Requested medication (s) are on the active medication list: yes  Last refill:  06/11/2019  Future visit scheduled: no  Notes to clinic:  no valid encounter within last 12 months    Requested Prescriptions  Pending Prescriptions Disp Refills   colchicine 0.6 MG tablet [Pharmacy Med Name: COLCHICINE 0.6 MG TABLET] 90 tablet 2    Sig: TAKE TWO TABLETS FIRST DAY, THEN ONE TABLET DAILY UNTIL GOUT IS [place]      Endocrinology:  Gout Agents Failed - 09/07/2019  3:22 PM      Failed - Uric Acid in normal range and within 360 days    No results found for: POCURA, LABURIC        Failed - Cr in normal range and within 360 days    Creatinine, Ser  Date Value Ref Range Status  07/07/2018 1.33 (H) 0.76 - 1.27 mg/dL Final          Failed - Valid encounter within last 12 months    Recent Outpatient Visits           1 year ago Coronary artery disease involving native coronary artery of native heart without angina pectoris   [facility] Birdie Sons, MD   1 year ago Acute gout of right ankle, unspecified cause   [facility] Birdie Sons, MD   1 year ago Annual physical exam   [facility] Birdie Sons, MD   1 year ago Sinusitis, unspecified chronicity, unspecified location   [facility] Birdie Sons, MD   2 years ago Acute right-sided low back pain without sciatica   [facility] Birdie Sons, MD

## 2019-09-17 ENCOUNTER — Other Ambulatory Visit: Payer: Self-pay | Admitting: Family Medicine

## 2019-09-17 NOTE — Telephone Encounter (Signed)
Requested medications are due for refill today?  Yes  Requested medications are on active medication list?  Yes  Last Refill:  09/24/2018 # 90 with 3 refills  Future visit scheduled?  NO.  Patient has not been seen since 06/04/2018. Patient is overdue for an office visit.  Per RX protocol patient has not had lab work for this medication since 07/07/2018 nor has he been followed in the office every 6 months. Patient will need to be contacted.   Notes to Clinic:

## 2019-10-05 DIAGNOSIS — H2513 Age-related nuclear cataract, bilateral: Secondary | ICD-10-CM | POA: Diagnosis not present

## 2019-10-05 DIAGNOSIS — H401131 Primary open-angle glaucoma, bilateral, mild stage: Secondary | ICD-10-CM | POA: Diagnosis not present

## 2019-10-11 ENCOUNTER — Other Ambulatory Visit: Payer: Self-pay | Admitting: Family Medicine

## 2019-10-12 ENCOUNTER — Telehealth: Payer: Self-pay

## 2019-10-12 NOTE — Telephone Encounter (Signed)
Spoke with patients wife she states that patient will be out of two of his medications and needs to see PCP prior to refill. Schedule appt for 10/13/19 at 4Pm. KW

## 2019-10-12 NOTE — Telephone Encounter (Signed)
Copied from [place] [phone]. Topic: Appointment Scheduling - Scheduling Inquiry for Clinic >> Oct 12, 2019  9:02 AM Mathis Bud wrote: Reason for CRM: Patient is requesting an appt asap due to rx refill. Patient needs an afternoon appt. Please call wife back [phone]

## 2019-10-13 ENCOUNTER — Encounter: Payer: Self-pay | Admitting: Family Medicine

## 2019-10-13 ENCOUNTER — Ambulatory Visit (INDEPENDENT_AMBULATORY_CARE_PROVIDER_SITE_OTHER): Payer: BC Managed Care – PPO | Admitting: Family Medicine

## 2019-10-13 ENCOUNTER — Other Ambulatory Visit: Payer: Self-pay

## 2019-10-13 VITALS — BP 160/75 | HR 55 | Temp 97.5°F | Resp 16 | Wt 189.0 lb

## 2019-10-13 DIAGNOSIS — I2510 Atherosclerotic heart disease of native coronary artery without angina pectoris: Secondary | ICD-10-CM | POA: Diagnosis not present

## 2019-10-13 DIAGNOSIS — E890 Postprocedural hypothyroidism: Secondary | ICD-10-CM

## 2019-10-13 DIAGNOSIS — E782 Mixed hyperlipidemia: Secondary | ICD-10-CM

## 2019-10-13 DIAGNOSIS — I10 Essential (primary) hypertension: Secondary | ICD-10-CM

## 2019-10-13 MED ORDER — TELMISARTAN 40 MG PO TABS: 40.0000 mg | ORAL_TABLET | Freq: Every day | ORAL | 3 refills | Status: DC

## 2019-10-13 NOTE — Patient Instructions (Signed)
.   Please review the attached list of medications and notify my office if there are any errors.    Please bring all of your medications to every appointment so we can make sure that our medication list is the same as yours.   . Please go to the lab draw station in [address] on the second floor of [facility]  when you are fasting for 8 hours. Normal hours are 8:00am to 12:30pm and 1:30pm to 4:00pm Monday through Friday     

## 2019-10-13 NOTE — Progress Notes (Signed)
Established patient visit     Patient: John Liu   DOB: 1946/02/08   74 y.o. Male  MRN: [id number] Visit Date: 10/13/2019  Today's healthcare provider: Lelon Huh, MD  Subjective:    Chief Complaint  Patient presents with  . Hyperlipidemia  . Hypertension  . Hypothyroidism   HPI  Lipid/Cholesterol, Follow-up:   Last seen for this 1 year ago.  Management changes since that visit include none. . Last Lipid Panel:    Component Value Date/Time   CHOL 167 07/07/2018 0929   TRIG 143 07/07/2018 0929   HDL 51 07/07/2018 0929   CHOLHDL 3.3 07/07/2018 0929   LDLCALC 87 07/07/2018 0929    Risk factors for vascular disease include hypercholesterolemia and hypertension  He reports good compliance with treatment. He is not having side effects.  Current symptoms include none and have been stable. Weight trend: stable Prior visit with dietician: no Current diet: well balanced Current exercise: walking and yard work  [employer] from Last 3 Encounters:  10/13/19 189 lb (85.7 kg)  06/04/18 195 lb (88.5 kg)  02/03/18 192 lb (87.1 kg)    -------------------------------------------------------------------  Follow up for Hypothyroidism:  The patient was last seen for this 1 year ago. Changes made at last visit include none.  He reports good compliance with treatment. He feels that condition is Unchanged. He is not having side effects.   ------------------------------------------------------------------------------------   Hypertension, follow-up:  BP Readings from Last 3 Encounters:  10/13/19 (!) 160/75  06/04/18 138/76  02/03/18 124/60    He was last seen for hypertension more than 1 year ago.  Management since that visit includes no changes. He reports good compliance with treatment. He is not having side effects.  He is exercising. He is adherent to low salt diet.   Outside blood pressures are not checked. He is experiencing none.  Patient  denies chest pain, chest pressure/discomfort, claudication, dyspnea, exertional chest pressure/discomfort, fatigue, irregular heart beat, lower extremity edema, near-syncope, orthopnea, palpitations, paroxysmal nocturnal dyspnea, syncope and tachypnea.   Cardiovascular risk factors include advanced age (older than 21 for men, 61 for women), dyslipidemia, hypertension and male gender.  Use of agents associated with hypertension: NSAIDS.     Weight trend: stable Wt Readings from Last 3 Encounters:  10/13/19 189 lb (85.7 kg)  06/04/18 195 lb (88.5 kg)  02/03/18 192 lb (87.1 kg)    Current diet: well balanced  ------------------------------------------------------------------------      Medications: Outpatient Medications Prior to Visit  Medication Sig  . aspirin 81 MG tablet Take 1 tablet by mouth daily.  . colchicine 0.6 MG tablet TAKE TWO TABLETS FIRST DAY, THEN ONE TABLET DAILY UNTIL GOUT IS GONE  . fluticasone (FLONASE) 50 MCG/ACT nasal spray SPRAY 2 SPRAYS INTO EACH NOSTRIL EVERY DAY  . levothyroxine (SYNTHROID) 100 MCG tablet TAKE 1 TABLET BY MOUTH EVERY DAY  . rosuvastatin (CRESTOR) 20 MG tablet TAKE 1 TABLET BY MOUTH EVERY DAY  . telmisartan (MICARDIS) 40 MG tablet Take 1 tablet (40 mg total) by mouth daily.  Marland Kitchen triamterene-hydrochlorothiazide (MAXZIDE-25) 37.5-25 MG tablet TAKE 1 TABLET BY MOUTH DAILY. PLEASE SCHEDULE OFFICE VISIT BEFORE ANY FUTURE REFILLS  . [DISCONTINUED] valsartan (DIOVAN) 160 MG tablet TAKE 1 TABLET BY MOUTH EVERY DAY   No facility-administered medications prior to visit.    Review of Systems  Constitutional: Negative for appetite change, chills and fever.  Respiratory: Negative for chest tightness, shortness of breath and wheezing.   Cardiovascular: Negative for  chest pain and palpitations.  Gastrointestinal: Negative for abdominal pain, nausea and vomiting.        Objective:    BP (!) 160/75 (BP Location: Left Arm, Patient Position: Sitting,  Cuff Size: Large)   Pulse (!) 55   Temp (!) 97.5 F (36.4 C) (Temporal)   Resp 16   Wt 189 lb (85.7 kg)   SpO2 97% Comment: room air  BMI 27.12 kg/m    Physical Exam   General: Appearance:     Well developed, well nourished male in no acute distress  Eyes:    PERRL, conjunctiva/corneas clear, EOM's intact       Lungs:     Clear to auscultation bilaterally, respirations unlabored  Heart:    Bradycardic. Normal rhythm. No murmurs, rubs, or gallops.   MS:   All extremities are intact.   Neurologic:   Awake, alert, oriented x 3. No apparent focal neurological           defect.            Assessment & Plan:    1. Essential (primary) hypertension Uncontrolled. Check labs as below. refill- telmisartan (MICARDIS) 40 MG tablet; Take 1 tablet (40 mg total) by mouth daily.  Dispense: 90 tablet; Refill: 3 Consider increasing maxzide after reviewing labs.   2. Coronary artery disease involving native coronary artery of native heart without angina pectoris Asymptomatic. Compliant with medication.    3. Hyperlipidemia, mixed He is tolerating rosuvastatin well with no adverse effects.   - Comprehensive metabolic panel - Lipid panel - CBC  4. Hypothyroidism, postop  - TSH    The entirety of the information documented in the History of Present Illness, Review of Systems and Physical Exam were personally obtained by me. Portions of this information were initially documented by Meyer Cory, CMA and reviewed by me for thoroughness and accuracy.     Lelon Huh, MD  [facility] [phone] (phone) [phone] (fax)  [place]

## 2019-10-20 DIAGNOSIS — E890 Postprocedural hypothyroidism: Secondary | ICD-10-CM | POA: Diagnosis not present

## 2019-10-20 DIAGNOSIS — E782 Mixed hyperlipidemia: Secondary | ICD-10-CM | POA: Diagnosis not present

## 2019-10-21 ENCOUNTER — Telehealth: Payer: Self-pay

## 2019-10-21 LAB — LIPID PANEL
Chol/HDL Ratio: 3.2 ratio (ref 0.0–5.0)
Cholesterol, Total: 155 mg/dL (ref 100–199)
HDL: 48 mg/dL (ref >39)
LDL Chol Calc (NIH): 86 mg/dL (ref 0–99)
Triglycerides: 120 mg/dL (ref 0–149)
VLDL Cholesterol Cal: 21 mg/dL (ref 5–40)

## 2019-10-21 LAB — CBC
Hematocrit: 37.4 % — ABNORMAL LOW (ref 37.5–51.0)
Hemoglobin: 13.2 g/dL (ref 13.0–17.7)
MCH: 32.6 pg (ref 26.6–33.0)
MCHC: 35.3 g/dL (ref 31.5–35.7)
MCV: 92 fL (ref 79–97)
Platelets: 200 10*3/uL (ref 150–450)
RBC: 4.05 x10E6/uL — ABNORMAL LOW (ref 4.14–5.80)
RDW: 13.8 % (ref 11.6–15.4)
WBC: 5.9 10*3/uL (ref 3.4–10.8)

## 2019-10-21 LAB — COMPREHENSIVE METABOLIC PANEL
ALT: 45 IU/L — ABNORMAL HIGH (ref 0–44)
AST: 42 IU/L — ABNORMAL HIGH (ref 0–40)
Albumin/Globulin Ratio: 1.8 (ref 1.2–2.2)
Albumin: 4.2 g/dL (ref 3.7–4.7)
Alkaline Phosphatase: 97 IU/L (ref 39–117)
BUN/Creatinine Ratio: 14 (ref 10–24)
BUN: 18 mg/dL (ref 8–27)
Bilirubin Total: 0.5 mg/dL (ref 0.0–1.2)
CO2: 22 mmol/L (ref 20–29)
Calcium: 9.2 mg/dL (ref 8.6–10.2)
Chloride: 102 mmol/L (ref 96–106)
Creatinine, Ser: 1.3 mg/dL — ABNORMAL HIGH (ref 0.76–1.27)
GFR calc Af Amer: 63 mL/min/{1.73_m2} (ref >59)
GFR calc non Af Amer: 55 mL/min/{1.73_m2} — ABNORMAL LOW (ref >59)
Globulin, Total: 2.4 g/dL (ref 1.5–4.5)
Glucose: 109 mg/dL — ABNORMAL HIGH (ref 65–99)
Potassium: 4.2 mmol/L (ref 3.5–5.2)
Sodium: 140 mmol/L (ref 134–144)
Total Protein: 6.6 g/dL (ref 6.0–8.5)

## 2019-10-21 LAB — TSH: TSH: 1.46 u[IU]/mL (ref 0.450–4.500)

## 2019-10-21 NOTE — Telephone Encounter (Signed)
Tried calling patient. Left message to call back. OK for PEC triage to advise patient when he returns call.  

## 2019-10-21 NOTE — Telephone Encounter (Signed)
-----   Message from Birdie Sons, MD sent at 10/21/2019  7:53 AM EDT ----- Labs are normal. Cholesterol is well controlled at 155.  Continue current medications.

## 2019-10-22 NOTE — Telephone Encounter (Signed)
Attempted to call pt.  Left vm on mobile # to call office to receive lab results.  (Home phone number was disconnected.)

## 2019-10-29 ENCOUNTER — Other Ambulatory Visit: Payer: Self-pay | Admitting: Family Medicine

## 2019-12-27 ENCOUNTER — Other Ambulatory Visit: Payer: Self-pay | Admitting: Family Medicine

## 2019-12-27 NOTE — Telephone Encounter (Addendum)
Requested Prescriptions  Pending Prescriptions Disp Refills  . triamterene-hydrochlorothiazide (MAXZIDE-25) 37.5-25 MG tablet [Pharmacy Med Name: TRIAMTERENE-HCTZ 37.5-25 MG TB] 90 tablet 1    Sig: TAKE 1 TABLET BY MOUTH DAILY. PLEASE SCHEDULE OFFICE VISIT BEFORE ANY FUTURE REFILLS     Cardiovascular: Diuretic Combos Failed - 12/27/2019  5:37 AM      Failed - Cr in normal range and within 360 days    Creatinine, Ser  Date Value Ref Range Status  10/20/2019 1.30 (H) 0.76 - 1.27 mg/dL Final         Failed - Last BP in normal range    BP Readings from Last 1 Encounters:  10/13/19 (!) 160/75         Passed - K in normal range and within 360 days    Potassium  Date Value Ref Range Status  10/20/2019 4.2 3.5 - 5.2 mmol/L Final         Passed - Na in normal range and within 360 days    Sodium  Date Value Ref Range Status  10/20/2019 140 134 - 144 mmol/L Final         Passed - Ca in normal range and within 360 days    Calcium  Date Value Ref Range Status  10/20/2019 9.2 8.6 - 10.2 mg/dL Final         Passed - Valid encounter within last 6 months    Recent Outpatient Visits          2 months ago Essential (primary) hypertension   [facility] Birdie Sons, MD   1 year ago Coronary artery disease involving native coronary artery of native heart without angina pectoris   [facility] Birdie Sons, MD   1 year ago Acute gout of right ankle, unspecified cause   [facility] Birdie Sons, MD   1 year ago Annual physical exam   [facility] Birdie Sons, MD   2 years ago Sinusitis, unspecified chronicity, unspecified location   [facility] Birdie Sons, MD             Per lab result note on 10/21/2019 patient to continue current medications.

## 2020-01-06 DIAGNOSIS — H401131 Primary open-angle glaucoma, bilateral, mild stage: Secondary | ICD-10-CM | POA: Diagnosis not present

## 2020-01-06 DIAGNOSIS — H2513 Age-related nuclear cataract, bilateral: Secondary | ICD-10-CM | POA: Diagnosis not present

## 2020-04-05 DIAGNOSIS — H2513 Age-related nuclear cataract, bilateral: Secondary | ICD-10-CM | POA: Diagnosis not present

## 2020-04-05 DIAGNOSIS — H401131 Primary open-angle glaucoma, bilateral, mild stage: Secondary | ICD-10-CM | POA: Diagnosis not present

## 2020-06-30 DIAGNOSIS — H2513 Age-related nuclear cataract, bilateral: Secondary | ICD-10-CM | POA: Diagnosis not present

## 2020-06-30 DIAGNOSIS — H401131 Primary open-angle glaucoma, bilateral, mild stage: Secondary | ICD-10-CM | POA: Diagnosis not present

## 2020-07-03 ENCOUNTER — Other Ambulatory Visit: Payer: Self-pay | Admitting: Family Medicine

## 2020-07-03 NOTE — Telephone Encounter (Signed)
Requested Prescriptions  Pending Prescriptions Disp Refills   triamterene-hydrochlorothiazide (MAXZIDE-25) 37.5-25 MG tablet [Pharmacy Med Name: TRIAMTERENE-HCTZ 37.5-25 MG TB] 30 tablet 0    Sig: TAKE 1 TABLET BY MOUTH DAILY. PLEASE SCHEDULE OFFICE VISIT BEFORE ANY FUTURE REFILLS     Cardiovascular: Diuretic Combos Failed - 07/03/2020  9:03 AM      Failed - Cr in normal range and within 360 days    Creatinine, Ser  Date Value Ref Range Status  10/20/2019 1.30 (H) 0.76 - 1.27 mg/dL Final         Failed - Last BP in normal range    BP Readings from Last 1 Encounters:  10/13/19 (!) 160/75         Failed - Valid encounter within last 6 months    Recent Outpatient Visits          8 months ago Essential (primary) hypertension   [facility], Kirstie Peri, MD   2 years ago Coronary artery disease involving native coronary artery of native heart without angina pectoris   [facility] Birdie Sons, MD   2 years ago Acute gout of right ankle, unspecified cause   [facility] Birdie Sons, MD   2 years ago Annual physical exam   [facility] Birdie Sons, MD   2 years ago Sinusitis, unspecified chronicity, unspecified location   [facility] Birdie Sons, MD             Passed - K in normal range and within 360 days    Potassium  Date Value Ref Range Status  10/20/2019 4.2 3.5 - 5.2 mmol/L Final         Passed - Na in normal range and within 360 days    Sodium  Date Value Ref Range Status  10/20/2019 140 134 - 144 mmol/L Final         Passed - Ca in normal range and within 360 days    Calcium  Date Value Ref Range Status  10/20/2019 9.2 8.6 - 10.2 mg/dL Final

## 2020-07-25 ENCOUNTER — Other Ambulatory Visit: Payer: Self-pay

## 2020-07-25 ENCOUNTER — Ambulatory Visit (INDEPENDENT_AMBULATORY_CARE_PROVIDER_SITE_OTHER): Payer: BC Managed Care – PPO | Admitting: Family Medicine

## 2020-07-25 DIAGNOSIS — J029 Acute pharyngitis, unspecified: Secondary | ICD-10-CM | POA: Diagnosis not present

## 2020-07-25 MED ORDER — AMOXICILLIN 500 MG PO CAPS
1000.0000 mg | ORAL_CAPSULE | Freq: Two times a day (BID) | ORAL | 0 refills | Status: AC
Start: 2020-07-25 — End: 2020-08-01

## 2020-07-25 NOTE — Progress Notes (Signed)
Virtual telephone visit    Virtual Visit via Telephone Note   This visit type was conducted due to national recommendations for restrictions regarding the COVID-19 Pandemic (e.g. social distancing) in an effort to limit this patient's exposure and mitigate transmission in our community. Due to his co-morbid illnesses, this patient is at least at moderate risk for complications without adequate follow up. This format is felt to be most appropriate for this patient at this time. The patient did not have access to video technology or had technical difficulties with video requiring transitioning to audio format only (telephone). Physical exam was limited to content and character of the telephone converstion.    Patient location: home Provider location: [facility]  I discussed the limitations of evaluation and management by telemedicine and the availability of in person appointments. The patient expressed understanding and agreed to proceed.   Visit Date: 07/25/2020  Today's healthcare provider: Lelon Huh, MD   Chief Complaint  Patient presents with  . Sore Throat   Subjective    Sore Throat  This is a new problem. Episode onset: 2 days ago. The problem has been gradually worsening. Maximum temperature: low grade 100. Associated symptoms include congestion, coughing, headaches, a hoarse voice and trouble swallowing. Pertinent negatives include no abdominal pain, ear pain, shortness of breath or vomiting. He has had no exposure to strep. He has tried NSAIDs (Robitussin DM) for the symptoms. The treatment provided no relief.       Medications: Outpatient Medications Prior to Visit  Medication Sig  . aspirin 81 MG tablet Take 1 tablet by mouth daily.  . colchicine 0.6 MG tablet TAKE TWO TABLETS FIRST DAY, THEN ONE TABLET DAILY UNTIL GOUT IS GONE  . fluticasone (FLONASE) 50 MCG/ACT nasal spray SPRAY 2 SPRAYS INTO EACH NOSTRIL EVERY DAY  . levothyroxine (SYNTHROID) 100 MCG tablet TAKE 1  TABLET BY MOUTH EVERY DAY  . rosuvastatin (CRESTOR) 20 MG tablet TAKE 1 TABLET BY MOUTH EVERY DAY  . telmisartan (MICARDIS) 40 MG tablet Take 1 tablet (40 mg total) by mouth daily.  Marland Kitchen triamterene-hydrochlorothiazide (MAXZIDE-25) 37.5-25 MG tablet TAKE 1 TABLET BY MOUTH DAILY. PLEASE SCHEDULE OFFICE VISIT BEFORE ANY FUTURE REFILLS   No facility-administered medications prior to visit.    Review of Systems  Constitutional: Positive for chills. Negative for appetite change and fever.  HENT: Positive for congestion, hoarse voice, rhinorrhea and trouble swallowing. Negative for ear pain.   Eyes: Negative for pain and discharge.  Respiratory: Positive for cough. Negative for chest tightness, shortness of breath and wheezing.   Cardiovascular: Negative for chest pain and palpitations.  Gastrointestinal: Negative for abdominal pain, nausea and vomiting.  Neurological: Positive for headaches.      Objective    There were no vitals taken for this visit.   Awake, alert, oriented x 3. In no apparent distress   Assessment & Plan     1. Pharyngitis, unspecified etiology  - amoxicillin (AMOXIL) 500 MG capsule; Take 2 capsules (1,000 mg total) by mouth 2 (two) times daily for 7 days.  Dispense: 28 capsule; Refill: 0   Call if symptoms change or if not rapidly improving.       I discussed the assessment and treatment plan with the patient. The patient was provided an opportunity to ask questions and all were answered. The patient agreed with the plan and demonstrated an understanding of the instructions.   The patient was advised to call back or seek an in-person evaluation if the  symptoms worsen or if the condition fails to improve as anticipated.  I provided 8 minutes of non-face-to-face time during this encounter.  The entirety of the information documented in the History of Present Illness, Review of Systems and Physical Exam were personally obtained by me. Portions of this information  were initially documented by the CMA and reviewed by me for thoroughness and accuracy.     Lelon Huh, MD [facility] [phone] (phone) [phone] (fax)  [place]

## 2020-07-26 ENCOUNTER — Other Ambulatory Visit: Payer: Self-pay | Admitting: Family Medicine

## 2020-09-15 ENCOUNTER — Other Ambulatory Visit: Payer: Self-pay | Admitting: Family Medicine

## 2020-09-19 DIAGNOSIS — H2513 Age-related nuclear cataract, bilateral: Secondary | ICD-10-CM | POA: Diagnosis not present

## 2020-09-19 DIAGNOSIS — H401131 Primary open-angle glaucoma, bilateral, mild stage: Secondary | ICD-10-CM | POA: Diagnosis not present

## 2020-10-02 ENCOUNTER — Other Ambulatory Visit: Payer: Self-pay | Admitting: Family Medicine

## 2020-10-02 DIAGNOSIS — I10 Essential (primary) hypertension: Secondary | ICD-10-CM

## 2020-10-02 NOTE — Telephone Encounter (Signed)
Requested medication (s) are due for refill today: yes  Requested medication (s) are on the active medication list: yes  Last refill:  10/13/19  Future visit scheduled: no  Notes to clinic:  called home phone number and recording stated it was disconnected. Called and LM on mobile number VM to call office to schedule appt for med RF   Requested Prescriptions  Pending Prescriptions Disp Refills   telmisartan (MICARDIS) 40 MG tablet [Pharmacy Med Name: TELMISARTAN 40 MG TABLET] 90 tablet 3    Sig: TAKE 1 TABLET BY MOUTH EVERY DAY      Cardiovascular:  Angiotensin Receptor Blockers Failed - 10/02/2020  9:17 AM      Failed - Cr in normal range and within 180 days    Creatinine, Ser  Date Value Ref Range Status  10/20/2019 1.30 (H) 0.76 - 1.27 mg/dL Final          Failed - K in normal range and within 180 days    Potassium  Date Value Ref Range Status  10/20/2019 4.2 3.5 - 5.2 mmol/L Final          Failed - Last BP in normal range    BP Readings from Last 1 Encounters:  10/13/19 (!) 160/75          Failed - Valid encounter within last 6 months    Recent Outpatient Visits           2 months ago Pharyngitis, unspecified etiology   [facility] Birdie Sons, MD   11 months ago Essential (primary) hypertension   [facility], Kirstie Peri, MD   2 years ago Coronary artery disease involving native coronary artery of native heart without angina pectoris   [facility] Birdie Sons, MD   2 years ago Acute gout of right ankle, unspecified cause   [facility] Birdie Sons, MD   2 years ago Annual physical exam   [facility] Birdie Sons, MD                Passed - Patient is not pregnant

## 2020-10-13 NOTE — Progress Notes (Signed)
Established patient visit   Patient: John Liu   DOB: [birth year]   75 y.o. Male  MRN: [id number] Visit Date: 10/14/2020  Today's healthcare provider: Lelon Huh, MD   Chief Complaint  Patient presents with  . Hypertension  . Hypothyroidism  . Hyperlipidemia  . Gout   Subjective    HPI  Hypertension, follow-up  BP Readings from Last 3 Encounters:  10/14/20 (!) 158/70  10/13/19 (!) 160/75  06/04/18 138/76   Wt Readings from Last 3 Encounters:  10/14/20 187 lb 9.6 oz (85.1 kg)  10/13/19 189 lb (85.7 kg)  06/04/18 195 lb (88.5 kg)     He was last seen for hypertension 1 years ago.  BP at that visit was 160/75. Management since that visit includes continuing current medications. He reports good compliance with treatment. He is not having side effects.  He is not exercising. He is not adherent to low salt diet.   Outside blood pressures are not checked.  He does not smoke.  Use of agents associated with hypertension: NSAIDS and thyroid hormones.   --------------------------------------------------------------------------------------------------- Lipid/Cholesterol, follow-up  Last Lipid Panel: Lab Results  Component Value Date   CHOL 155 10/20/2019   LDLCALC 86 10/20/2019   HDL 48 10/20/2019   TRIG 120 10/20/2019    He was last seen for this 1 years ago.  Management since that visit includes continue Crestor 20mg .  He reports good compliance with treatment. He is not having side effects.   Symptoms: No appetite changes No foot ulcerations  No chest pain No chest pressure/discomfort  No dyspnea No orthopnea  No fatigue No lower extremity edema  No palpitations No paroxysmal nocturnal dyspnea  No nausea No numbness or tingling of extremity  No polydipsia No polyuria  No speech difficulty No syncope   He is following a Regular diet. Current exercise: none  Last metabolic panel Lab Results  Component Value Date   GLUCOSE 109 (H)  10/20/2019   NA 140 10/20/2019   K 4.2 10/20/2019   BUN 18 10/20/2019   CREATININE 1.30 (H) 10/20/2019   GFRNONAA 55 (L) 10/20/2019   GFRAA 63 10/20/2019   CALCIUM 9.2 10/20/2019   AST 42 (H) 10/20/2019   ALT 45 (H) 10/20/2019   The 10-year ASCVD risk score Mikey Bussing DC Jr., et al., 2013) is: 27.2%  Hypothyroid, follow-up  Lab Results  Component Value Date   TSH 1.460 10/20/2019   TSH 1.300 07/07/2018   TSH 1.280 02/04/2018   T4TOTAL 6.7 03/14/2016   Wt Readings from Last 3 Encounters:  10/14/20 187 lb 9.6 oz (85.1 kg)  10/13/19 189 lb (85.7 kg)  06/04/18 195 lb (88.5 kg)    He was last seen for hypothyroid 1 years ago.  Management since that visit includes no medication changes. He reports good compliance with treatment. He is not having side effects.   Symptoms: No change in energy level No constipation  No diarrhea No heat / cold intolerance  No nervousness No palpitations  No weight changes    Follow up for gout:  Patient is currently taking Colchicine 0.6 mg daily. He reports good compliance with treatment. He feels that condition is Worse. He states he has a a gout flare up in his right second MCP for the past 3 weeks. He feels that the colchicine is not helping to resolve symptoms. However he states he has never had gout in hands before.   He is not having side effects.   -----------------------------------------------------------------------------------------  Medications: Outpatient Medications Prior to Visit  Medication Sig  . aspirin 81 MG tablet Take 1 tablet by mouth daily.  . colchicine 0.6 MG tablet TAKE TWO TABLETS FIRST DAY, THEN ONE TABLET DAILY UNTIL GOUT IS GONE  . fluticasone (FLONASE) 50 MCG/ACT nasal spray SPRAY 2 SPRAYS INTO EACH NOSTRIL EVERY DAY  . levothyroxine (SYNTHROID) 100 MCG tablet TAKE 1 TABLET BY MOUTH EVERY DAY  . rosuvastatin (CRESTOR) 20 MG tablet TAKE 1 TABLET BY MOUTH EVERY DAY  . telmisartan (MICARDIS) 40 MG tablet  TAKE 1 TABLET BY MOUTH EVERY DAY  . triamterene-hydrochlorothiazide (MAXZIDE-25) 37.5-25 MG tablet Take 1 tablet by mouth daily.   No facility-administered medications prior to visit.    Review of Systems  Constitutional: Negative for appetite change, chills and fever.  Respiratory: Negative for chest tightness, shortness of breath and wheezing.   Cardiovascular: Negative for chest pain and palpitations.  Gastrointestinal: Negative for abdominal pain, nausea and vomiting.  Musculoskeletal: Positive for arthralgias and joint swelling.       Objective    BP (!) 158/70 (BP Location: Right Arm, Patient Position: Sitting, Cuff Size: Normal)   Pulse (!) 55   Temp 98.9 F (37.2 C) (Temporal)   Resp 16   Ht 5\' 10"  (1.778 m)   Wt 187 lb 9.6 oz (85.1 kg)   BMI 26.92 kg/m     Physical Exam   General: Appearance:     Well developed, well nourished male in no acute distress  Eyes:    PERRL, conjunctiva/corneas clear, EOM's intact       Lungs:     Clear to auscultation bilaterally, respirations unlabored  Heart:    Bradycardic. Normal rhythm. No murmurs, rubs, or gallops.   MS:   All extremities are intact. Right second MCP swollen and tender. Slightly erythematous.   Neurologic:   Awake, alert, oriented x 3. No apparent focal neurological           defect.         Assessment & Plan     1. Hyperlipidemia, mixed He is tolerating rosuvastatin well with no adverse effects.   - CBC with Differential/Platelet - Comprehensive metabolic panel - Lipid panel  2. Essential (primary) hypertension Not goal. See how labs look. Consider adding amlodipine.  - EKG 12-Lead  3. Hypothyroidism, postop  - TSH  4. Right hand pain He does have history of gout in toes, but never in hands. See how xray looks. Consider indomethacin if not improving over the weekend.  - DG Hand Complete Right; Future  5. Need for vaccination against Streptococcus pneumoniae  - Pneumococcal polysaccharide  vaccine 23-valent  6. Acute gout of hand, unspecified cause, unspecified laterality ??? - Uric acid  7. Prostate cancer screening  - PSA            Lelon Huh, MD  [facility] [phone] (phone) [phone] (fax)  [place]

## 2020-10-14 ENCOUNTER — Other Ambulatory Visit: Payer: Self-pay

## 2020-10-14 ENCOUNTER — Ambulatory Visit: Payer: BC Managed Care – PPO | Admitting: Family Medicine

## 2020-10-14 ENCOUNTER — Ambulatory Visit: Admission: RE | Admit: 2020-10-14 | Payer: Self-pay | Source: Ambulatory Visit

## 2020-10-14 ENCOUNTER — Encounter: Payer: Self-pay | Admitting: Family Medicine

## 2020-10-14 ENCOUNTER — Ambulatory Visit
Admission: RE | Admit: 2020-10-14 | Discharge: 2020-10-14 | Disposition: A | Discharge status: Home / Self Care | Payer: BC Managed Care – PPO | Source: Ambulatory Visit | Attending: Family Medicine | Admitting: Family Medicine

## 2020-10-14 ENCOUNTER — Ambulatory Visit
Admission: RE | Admit: 2020-10-14 | Discharge: 2020-10-14 | Disposition: A | Discharge status: Home / Self Care | Payer: BC Managed Care – PPO | Attending: Family Medicine | Admitting: Family Medicine

## 2020-10-14 VITALS — BP 158/70 | HR 55 | Temp 98.9°F | Resp 16 | Ht 70.0 in | Wt 187.6 lb

## 2020-10-14 DIAGNOSIS — I10 Essential (primary) hypertension: Secondary | ICD-10-CM | POA: Diagnosis not present

## 2020-10-14 DIAGNOSIS — Z23 Encounter for immunization: Secondary | ICD-10-CM

## 2020-10-14 DIAGNOSIS — Z125 Encounter for screening for malignant neoplasm of prostate: Secondary | ICD-10-CM

## 2020-10-14 DIAGNOSIS — M79641 Pain in right hand: Secondary | ICD-10-CM | POA: Diagnosis not present

## 2020-10-14 DIAGNOSIS — E890 Postprocedural hypothyroidism: Secondary | ICD-10-CM | POA: Diagnosis not present

## 2020-10-14 DIAGNOSIS — E782 Mixed hyperlipidemia: Secondary | ICD-10-CM

## 2020-10-14 DIAGNOSIS — M109 Gout, unspecified: Secondary | ICD-10-CM

## 2020-10-14 DIAGNOSIS — M19041 Primary osteoarthritis, right hand: Secondary | ICD-10-CM | POA: Diagnosis not present

## 2020-10-14 NOTE — Patient Instructions (Addendum)
.   Go to the [facility] on [address] for right Editor, commissioning   . Please go to the lab draw station in [address] on the second floor of [facility]  when you are fasting for 8 hours. Normal hours are 8:00am to 11:30am and 1:00pm to 4:00pm Monday through Friday

## 2020-10-18 ENCOUNTER — Encounter: Payer: Self-pay | Admitting: Family Medicine

## 2020-10-18 DIAGNOSIS — M19049 Primary osteoarthritis, unspecified hand: Secondary | ICD-10-CM | POA: Insufficient documentation

## 2020-10-19 DIAGNOSIS — Z125 Encounter for screening for malignant neoplasm of prostate: Secondary | ICD-10-CM | POA: Diagnosis not present

## 2020-10-19 DIAGNOSIS — E890 Postprocedural hypothyroidism: Secondary | ICD-10-CM | POA: Diagnosis not present

## 2020-10-19 DIAGNOSIS — M109 Gout, unspecified: Secondary | ICD-10-CM | POA: Diagnosis not present

## 2020-10-19 DIAGNOSIS — E782 Mixed hyperlipidemia: Secondary | ICD-10-CM | POA: Diagnosis not present

## 2020-10-20 LAB — COMPREHENSIVE METABOLIC PANEL
ALT: 51 IU/L — ABNORMAL HIGH (ref 0–44)
AST: 51 IU/L — ABNORMAL HIGH (ref 0–40)
Albumin/Globulin Ratio: 1.5 (ref 1.2–2.2)
Albumin: 4.1 g/dL (ref 3.7–4.7)
Alkaline Phosphatase: 120 IU/L (ref 44–121)
BUN/Creatinine Ratio: 15 (ref 10–24)
BUN: 19 mg/dL (ref 8–27)
Bilirubin Total: 0.4 mg/dL (ref 0.0–1.2)
CO2: 21 mmol/L (ref 20–29)
Calcium: 9.1 mg/dL (ref 8.6–10.2)
Chloride: 104 mmol/L (ref 96–106)
Creatinine, Ser: 1.23 mg/dL (ref 0.76–1.27)
Globulin, Total: 2.7 g/dL (ref 1.5–4.5)
Glucose: 104 mg/dL — ABNORMAL HIGH (ref 65–99)
Potassium: 4.3 mmol/L (ref 3.5–5.2)
Sodium: 142 mmol/L (ref 134–144)
Total Protein: 6.8 g/dL (ref 6.0–8.5)
eGFR: 62 mL/min/{1.73_m2} (ref >59)

## 2020-10-20 LAB — LIPID PANEL
Chol/HDL Ratio: 3.7 ratio (ref 0.0–5.0)
Cholesterol, Total: 168 mg/dL (ref 100–199)
HDL: 46 mg/dL (ref >39)
LDL Chol Calc (NIH): 100 mg/dL — ABNORMAL HIGH (ref 0–99)
Triglycerides: 125 mg/dL (ref 0–149)
VLDL Cholesterol Cal: 22 mg/dL (ref 5–40)

## 2020-10-20 LAB — CBC WITH DIFFERENTIAL/PLATELET
Basophils Absolute: 0 10*3/uL (ref 0.0–0.2)
Basos: 1 %
EOS (ABSOLUTE): 0.1 10*3/uL (ref 0.0–0.4)
Eos: 2 %
Hematocrit: 38.1 % (ref 37.5–51.0)
Hemoglobin: 13 g/dL (ref 13.0–17.7)
Immature Grans (Abs): 0 10*3/uL (ref 0.0–0.1)
Immature Granulocytes: 0 %
Lymphocytes Absolute: 3.1 10*3/uL (ref 0.7–3.1)
Lymphs: 46 %
MCH: 31.3 pg (ref 26.6–33.0)
MCHC: 34.1 g/dL (ref 31.5–35.7)
MCV: 92 fL (ref 79–97)
Monocytes Absolute: 0.7 10*3/uL (ref 0.1–0.9)
Monocytes: 11 %
Neutrophils Absolute: 2.7 10*3/uL (ref 1.4–7.0)
Neutrophils: 40 %
Platelets: 206 10*3/uL (ref 150–450)
RBC: 4.16 x10E6/uL (ref 4.14–5.80)
RDW: 14.5 % (ref 11.6–15.4)
WBC: 6.7 10*3/uL (ref 3.4–10.8)

## 2020-10-20 LAB — PSA: Prostate Specific Ag, Serum: 1.4 ng/mL (ref 0.0–4.0)

## 2020-10-20 LAB — TSH: TSH: 0.991 u[IU]/mL (ref 0.450–4.500)

## 2020-10-20 LAB — URIC ACID: Uric Acid: 8.9 mg/dL — ABNORMAL HIGH (ref 3.8–8.4)

## 2020-10-21 ENCOUNTER — Telehealth: Payer: Self-pay

## 2020-10-21 DIAGNOSIS — I10 Essential (primary) hypertension: Secondary | ICD-10-CM

## 2020-10-21 NOTE — Telephone Encounter (Signed)
Called patient and no answer, [place]. If patient calls back okay for PEC to advise of results.

## 2020-10-21 NOTE — Telephone Encounter (Signed)
-----   Message from Birdie Sons, MD sent at 10/21/2020  7:52 AM EDT ----- Liver functions are up slightly. Avoid alcohol consumption. Uric acid levels are high, indicating he may have some gout. Rest of labs are normal. Recommend he start allopurinol 100mg  daily #90 rf x 3 to get uric acid levels down. Should also take colchicine (which he has a prescription) once a day every day for a month.  Also need to ADD amlodipine 2.5mg  one tablet daily #90, rf x 1 to get blood pressure down. Need to follow up in 2 months to check on blood pressure, liver functions and uric acid levels.

## 2020-10-25 MED ORDER — ALLOPURINOL 100 MG PO TABS: 100.0000 mg | ORAL_TABLET | Freq: Every day | ORAL | 3 refills | Status: DC

## 2020-10-25 MED ORDER — AMLODIPINE BESYLATE 2.5 MG PO TABS: 2.5000 mg | ORAL_TABLET | Freq: Every day | ORAL | 1 refills | Status: DC

## 2020-10-25 NOTE — Telephone Encounter (Signed)
Per initial encounter dated 10/21/20, please see Dr Maralyn Sago message; attempted to contact pt; left message on cell phone for pt to call the office; attempted to contact pt on home phone but message states it is no longer in service; medication requests approved; please notify pt of Dr Maralyn Sago message and recommendations.  Requested Prescriptions  Pending Prescriptions Disp Refills  . amLODipine (NORVASC) 2.5 MG tablet 90 tablet 1    Sig: Take 1 tablet (2.5 mg total) by mouth daily.     Cardiovascular:  Calcium Channel Blockers Failed - 10/25/2020 11:35 AM      Failed - Last BP in normal range    BP Readings from Last 1 Encounters:  10/14/20 (!) 158/70         Passed - Valid encounter within last 6 months    Recent Outpatient Visits          1 week ago Hyperlipidemia, mixed   [facility] Birdie Sons, MD   3 months ago Pharyngitis, unspecified etiology   [facility] Birdie Sons, MD   1 year ago Essential (primary) hypertension   [facility] Birdie Sons, MD   2 years ago Coronary artery disease involving native coronary artery of native heart without angina pectoris   [facility] Birdie Sons, MD   2 years ago Acute gout of right ankle, unspecified cause   [facility] Birdie Sons, MD             . allopurinol (ZYLOPRIM) 100 MG tablet 90 tablet 3    Sig: Take 1 tablet (100 mg total) by mouth daily.     Endocrinology:  Gout Agents Failed - 10/25/2020 11:35 AM      Failed - Uric Acid in normal range and within 360 days    Uric Acid  Date Value Ref Range Status  10/19/2020 8.9 (H) 3.8 - 8.4 mg/dL Final    Comment:               Therapeutic target for gout patients: <6.0         Passed - Cr in normal range and within 360 days    Creatinine, Ser  Date Value Ref Range Status  10/19/2020 1.23 0.76 - 1.27 mg/dL Final         Passed - Valid encounter within last 12 months    Recent  Outpatient Visits          1 week ago Hyperlipidemia, mixed   [facility] Birdie Sons, MD   3 months ago Pharyngitis, unspecified etiology   [facility] Birdie Sons, MD   1 year ago Essential (primary) hypertension   [place], Kirstie Peri, MD   2 years ago Coronary artery disease involving native coronary artery of native heart without angina pectoris   [facility] Birdie Sons, MD   2 years ago Acute gout of right ankle, unspecified cause   [facility] Birdie Sons, MD

## 2020-10-25 NOTE — Telephone Encounter (Addendum)
Tried calling patient. Left message to call back. OK for [facility] triage to advise of message, send in prescriptions if patient is agreeable and schedule follow up appointment. Medications are pending in this encounter.

## 2020-10-26 NOTE — Telephone Encounter (Signed)
FYI

## 2020-10-26 NOTE — Telephone Encounter (Signed)
Pt's wife given message per Dr Caryn Section, "---- Message from Birdie Sons, MD sent at 10/21/2020  7:52 AM EDT -----Liver functions are up slightly. Avoid alcohol consumption. Uric acid levels are high, indicating he may have some gout. Rest of labs are normal. Recommend he start allopurinol 100mg  daily #90 rf x 3 to get uric acid levels down. Should also take colchicine (which he has a prescription) once a day every day for a month. Also need to ADD amlodipine 2.5mg  one tablet daily #90, rf x 1 to get blood pressure down. Need to follow up in 2 months to check on blood pressure, liver functions and uric acid levels."; she verbalized understanding and says they will call back to schedule his follow up appt; also informed pt's wife the prescriptions were sent to [facility]; she states the pt was notified by the pharmacy that he had medications to pick up; will route to office for notification of encounter.

## 2020-12-12 ENCOUNTER — Other Ambulatory Visit: Payer: Self-pay | Admitting: Family Medicine

## 2020-12-23 ENCOUNTER — Encounter: Payer: Self-pay | Admitting: Family Medicine

## 2020-12-23 ENCOUNTER — Ambulatory Visit: Payer: BC Managed Care – PPO | Admitting: Family Medicine

## 2020-12-23 ENCOUNTER — Other Ambulatory Visit: Payer: Self-pay

## 2020-12-23 VITALS — BP 139/70 | HR 57 | Wt 182.0 lb

## 2020-12-23 DIAGNOSIS — E790 Hyperuricemia without signs of inflammatory arthritis and tophaceous disease: Secondary | ICD-10-CM | POA: Diagnosis not present

## 2020-12-23 DIAGNOSIS — I10 Essential (primary) hypertension: Secondary | ICD-10-CM | POA: Diagnosis not present

## 2020-12-23 DIAGNOSIS — E890 Postprocedural hypothyroidism: Secondary | ICD-10-CM | POA: Diagnosis not present

## 2020-12-23 DIAGNOSIS — M109 Gout, unspecified: Secondary | ICD-10-CM

## 2020-12-23 NOTE — Progress Notes (Signed)
Established patient visit   Patient: John Liu   DOB: 03-02-1946   75 y.o. Male  MRN: [id number] Visit Date: 12/23/2020  Today's healthcare provider: Lelon Huh, MD   No chief complaint on file.  Subjective    HPI  Follow up for Gout:  The patient was last seen for this 2 months ago. Changes made at last visit include start allopurinol 100mg  daily. Patient was also advised to  take colchicine once a day every day for a month.  He reports excellent compliance with treatment. He feels that condition is Improved. He is not having side effects.   -----------------------------------------------------------------------------------------   Hypertension, follow-up  BP Readings from Last 3 Encounters:  12/23/20 139/70  10/14/20 (!) 158/70  10/13/19 (!) 160/75   Wt Readings from Last 3 Encounters:  12/23/20 182 lb (82.6 kg)  10/14/20 187 lb 9.6 oz (85.1 kg)  10/13/19 189 lb (85.7 kg)     He was last seen for hypertension 2 months ago.  BP at that visit was 158/70. Management since that visit includes adding amlodipine 2.5mg  one tablet daily.  He reports fair compliance with treatment. He is having side effects. Pt reports Amlodipine is causing fatigue so he is taking it every other day. He is following a Low Sodium diet. He is not exercising. He does not smoke.  Use of agents associated with hypertension: NSAIDS.   Outside blood pressures are not being checked. Symptoms: No chest pain No chest pressure  No palpitations No syncope  No dyspnea No orthopnea  No paroxysmal nocturnal dyspnea No lower extremity edema   Pertinent labs: Lab Results  Component Value Date   CHOL 168 10/19/2020   HDL 46 10/19/2020   LDLCALC 100 (H) 10/19/2020   TRIG 125 10/19/2020   CHOLHDL 3.7 10/19/2020   Lab Results  Component Value Date   NA 142 10/19/2020   K 4.3 10/19/2020   CREATININE 1.23 10/19/2020   GFRNONAA 55 (L) 10/20/2019   GFRAA 63 10/20/2019   GLUCOSE  104 (H) 10/19/2020     The 10-year ASCVD risk score Mikey Bussing DC Jr., et al., 2013) is: 22.7%   ---------------------------------------------------------------------------------------------------   Follow up for elevated liver functions:  The patient was last seen for this 2 months ago. During that visit labs were ordered showing liver functions were up slightly. Patient was advised to avoid alcohol consumption. -----------------------------------------------------------------------------------------     Medications: Outpatient Medications Prior to Visit  Medication Sig   allopurinol (ZYLOPRIM) 100 MG tablet Take 1 tablet (100 mg total) by mouth daily.   amLODipine (NORVASC) 2.5 MG tablet Take 1 tablet (2.5 mg total) by mouth daily. (Patient taking differently: Take 2.5 mg by mouth every other day.)   aspirin 81 MG tablet Take 1 tablet by mouth daily.   colchicine 0.6 MG tablet TAKE TWO TABLETS FIRST DAY, THEN ONE TABLET DAILY UNTIL GOUT IS GONE   fluticasone (FLONASE) 50 MCG/ACT nasal spray SPRAY 2 SPRAYS INTO EACH NOSTRIL EVERY DAY   levothyroxine (SYNTHROID) 100 MCG tablet TAKE 1 TABLET BY MOUTH EVERY DAY   rosuvastatin (CRESTOR) 20 MG tablet TAKE 1 TABLET BY MOUTH EVERY DAY   telmisartan (MICARDIS) 40 MG tablet TAKE 1 TABLET BY MOUTH EVERY DAY   triamterene-hydrochlorothiazide (MAXZIDE-25) 37.5-25 MG tablet Take 1 tablet by mouth daily.   No facility-administered medications prior to visit.    Review of Systems  Constitutional: Negative.   Respiratory: Negative.    Cardiovascular: Negative.   Gastrointestinal:  Negative.   Neurological:  Negative for dizziness, speech difficulty, light-headedness, numbness and headaches.      Objective    BP 139/70 (BP Location: Right Arm, Patient Position: Sitting, Cuff Size: Large)   Pulse (!) 57   Wt 182 lb (82.6 kg)   SpO2 99%   BMI 26.11 kg/m     Physical Exam   General: Appearance:     Well developed, well nourished male in no  acute distress  Eyes:    PERRL, conjunctiva/corneas clear, EOM's intact       Lungs:     Clear to auscultation bilaterally, respirations unlabored  Heart:    Bradycardic. Normal rhythm. No murmurs, rubs, or gallops.   MS:   All extremities are intact.    Neurologic:   Awake, alert, oriented x 3. No apparent focal neurological           defect.         Assessment & Plan     1. Essential (primary) hypertension Fairly well controlled. Continue current medications.    2. Hypothyroidism, postop Lab Results  Component Value Date   TSH 0.991 10/19/2020    Continue current medications.   3. Acute gout of hand, unspecified cause, unspecified laterality Greatly improved, but is not only taking colchicine.   4. Hyperuricemia Counseled that he should start back on allopurinol and take indefinitely to reduce risk of gout flares in the future. He can continue colchicine for one more month after starting back on allopurinol, then only take prn.         The entirety of the information documented in the History of Present Illness, Review of Systems and Physical Exam were personally obtained by me. Portions of this information were initially documented by the CMA and reviewed by me for thoroughness and accuracy.     Lelon Huh, MD  [facility] [phone] (phone) [phone] (fax)  [place]

## 2020-12-23 NOTE — Patient Instructions (Addendum)
Continue taking colchicine every day  Start taking allopurinol once a day every day to help clear uric acid out of your system  We'll contact you in early July to check the uric acid and thyroid

## 2020-12-24 ENCOUNTER — Other Ambulatory Visit: Payer: Self-pay | Admitting: Family Medicine

## 2020-12-24 NOTE — Telephone Encounter (Signed)
Requested Prescriptions  Pending Prescriptions Disp Refills  . triamterene-hydrochlorothiazide (MAXZIDE-25) 37.5-25 MG tablet [Pharmacy Med Name: TRIAMTERENE-HCTZ 37.5-25 MG TB] 90 tablet 1    Sig: TAKE 1 TABLET BY MOUTH EVERY DAY     Cardiovascular: Diuretic Combos Passed - 12/24/2020  1:18 PM      Passed - K in normal range and within 360 days    Potassium  Date Value Ref Range Status  10/19/2020 4.3 3.5 - 5.2 mmol/L Final         Passed - Na in normal range and within 360 days    Sodium  Date Value Ref Range Status  10/19/2020 142 134 - 144 mmol/L Final         Passed - Cr in normal range and within 360 days    Creatinine, Ser  Date Value Ref Range Status  10/19/2020 1.23 0.76 - 1.27 mg/dL Final         Passed - Ca in normal range and within 360 days    Calcium  Date Value Ref Range Status  10/19/2020 9.1 8.6 - 10.2 mg/dL Final         Passed - Last BP in normal range    BP Readings from Last 1 Encounters:  12/23/20 139/70         Passed - Valid encounter within last 6 months    Recent Outpatient Visits          Yesterday Essential (primary) hypertension   [facility] Birdie Sons, MD   2 months ago Hyperlipidemia, mixed   [facility] Birdie Sons, MD   5 months ago Pharyngitis, unspecified etiology   [facility] Birdie Sons, MD   1 year ago Essential (primary) hypertension   [place] Birdie Sons, MD   2 years ago Coronary artery disease involving native coronary artery of native heart without angina pectoris   [facility] Caryn Section, Kirstie Peri, MD

## 2020-12-28 DIAGNOSIS — H2513 Age-related nuclear cataract, bilateral: Secondary | ICD-10-CM | POA: Diagnosis not present

## 2020-12-28 DIAGNOSIS — H401131 Primary open-angle glaucoma, bilateral, mild stage: Secondary | ICD-10-CM | POA: Diagnosis not present

## 2021-01-16 ENCOUNTER — Other Ambulatory Visit: Payer: Self-pay | Admitting: Family Medicine

## 2021-03-23 ENCOUNTER — Other Ambulatory Visit: Payer: Self-pay | Admitting: Family Medicine

## 2021-03-23 DIAGNOSIS — I10 Essential (primary) hypertension: Secondary | ICD-10-CM

## 2021-03-27 DIAGNOSIS — H2513 Age-related nuclear cataract, bilateral: Secondary | ICD-10-CM | POA: Diagnosis not present

## 2021-03-27 DIAGNOSIS — H401131 Primary open-angle glaucoma, bilateral, mild stage: Secondary | ICD-10-CM | POA: Diagnosis not present

## 2021-05-09 DIAGNOSIS — H524 Presbyopia: Secondary | ICD-10-CM | POA: Diagnosis not present

## 2021-05-09 DIAGNOSIS — H401131 Primary open-angle glaucoma, bilateral, mild stage: Secondary | ICD-10-CM | POA: Diagnosis not present

## 2021-05-09 DIAGNOSIS — H5203 Hypermetropia, bilateral: Secondary | ICD-10-CM | POA: Diagnosis not present

## 2021-05-09 DIAGNOSIS — H2513 Age-related nuclear cataract, bilateral: Secondary | ICD-10-CM | POA: Diagnosis not present

## 2021-05-11 ENCOUNTER — Ambulatory Visit: Payer: Self-pay | Admitting: *Deleted

## 2021-05-11 DIAGNOSIS — M109 Gout, unspecified: Secondary | ICD-10-CM

## 2021-05-11 MED ORDER — COLCHICINE 0.6 MG PO TABS: ORAL_TABLET | ORAL | 2 refills | Status: DC

## 2021-05-11 NOTE — Telephone Encounter (Signed)
Patient with gout flare up. He began taking Allopurinol 100 mg tabs one week ago but was not sure about taking colchicine with it.  Per 12/23/20 the patient should continue to take Colchicine 0.6mg  one tab daily during the 1st 30 days of starting the Allopurinol. Then, PRN after that. He is out of Colchicine.  Need new Colchicine 0.6 mg prescription with the above instructions included. Pharmacy on file.  Care advice including increase daily water intake, OTC anti inflammatory for pain.       Reason for Disposition  [1] Caller has URGENT medicine question about med that PCP or specialist prescribed AND [2] triager unable to answer question  Answer Assessment - Initial Assessment Questions 1. NAME of MEDICATION: "What medicine are you calling about?"     patient 2. QUESTION: "What is your question?" (e.g., double dose of medicine, side effect)     Do I need to take Colchicine along with Allopurinol 3. PRESCRIBING HCP: "Who prescribed it?" Reason: if prescribed by specialist, call should be referred to that group.     Dr. Caryn Section 4. SYMPTOMS: "Do you have any symptoms?"     Yes 5. SEVERITY: If symptoms are present, ask "Are they mild, moderate or severe?"     Pain-moderate 6. PREGNANCY:  "Is there any chance that you are pregnant?" "When was your last menstrual period?"     na  Protocols used: Medication Question Call-A-AH

## 2021-05-11 NOTE — Telephone Encounter (Signed)
Attempted to return pt's call.   He had called in earlier with a question pertaining to the allopurinol.   I left a voicemail to call us back and any of the nurses would be glad to assist him.

## 2021-05-11 NOTE — Telephone Encounter (Signed)
Second attempt to reach pt. Message left to call back. 

## 2021-06-06 DIAGNOSIS — H5203 Hypermetropia, bilateral: Secondary | ICD-10-CM | POA: Diagnosis not present

## 2021-06-06 DIAGNOSIS — H2513 Age-related nuclear cataract, bilateral: Secondary | ICD-10-CM | POA: Diagnosis not present

## 2021-06-06 DIAGNOSIS — H401131 Primary open-angle glaucoma, bilateral, mild stage: Secondary | ICD-10-CM | POA: Diagnosis not present

## 2021-06-06 DIAGNOSIS — H524 Presbyopia: Secondary | ICD-10-CM | POA: Diagnosis not present

## 2021-06-16 IMAGING — CR DG HAND COMPLETE 3+V*R*
1 series · 3 of 3 positions shown · non-contrast
Comparison: None.

CLINICAL DATA: Hand pain.

EXAM:
RIGHT HAND - COMPLETE 3+ VIEW

[Series 1: dg hand complete right · 0.14mm/px · 3 of 3 slices shown]
[im 1/3]
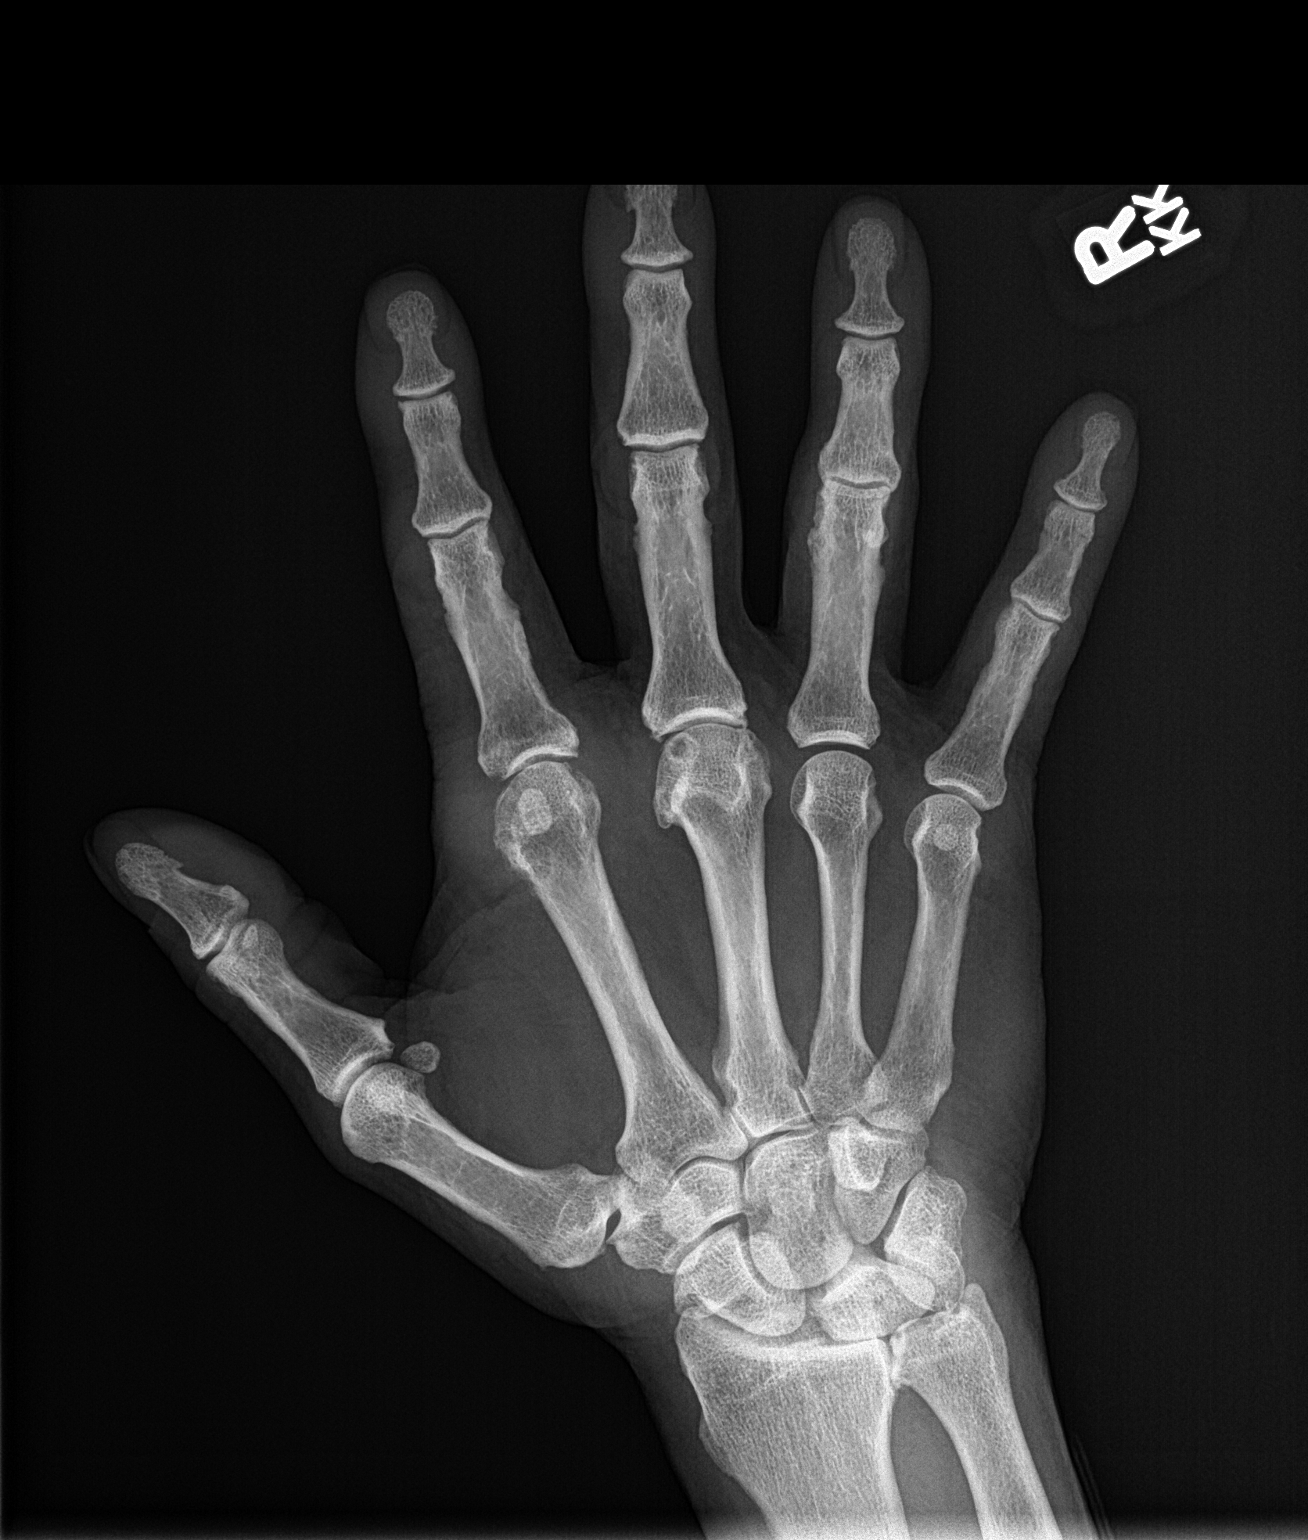
[im 2/3]
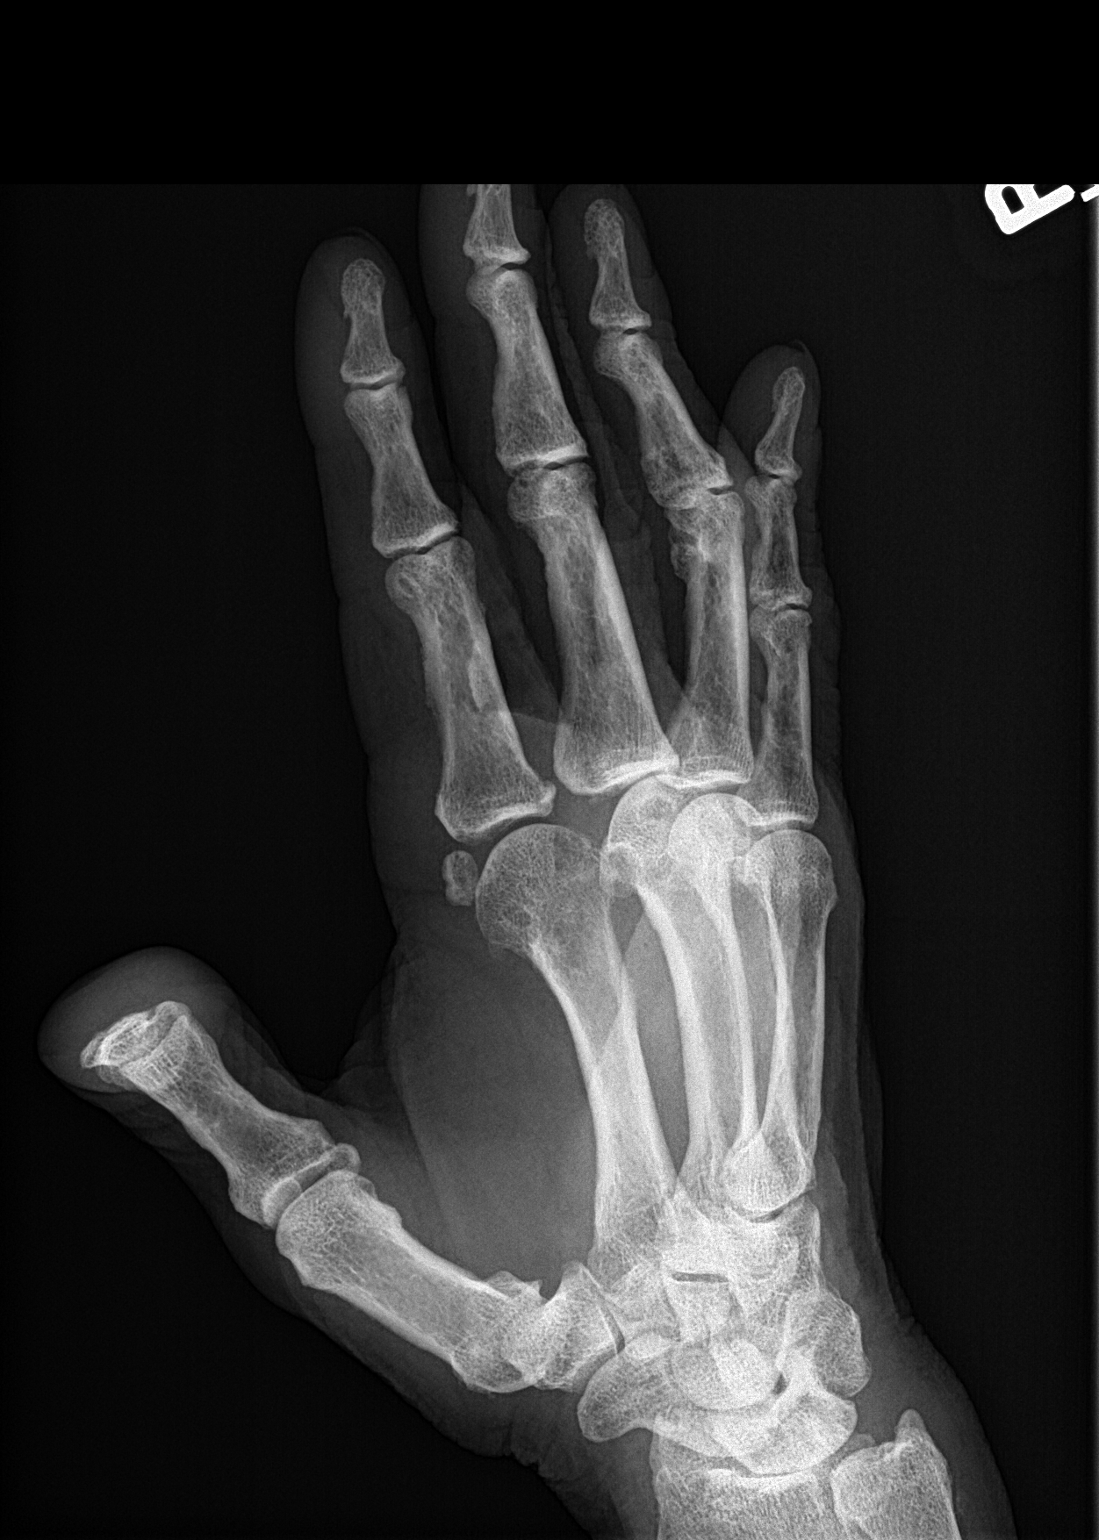
[im 3/3]
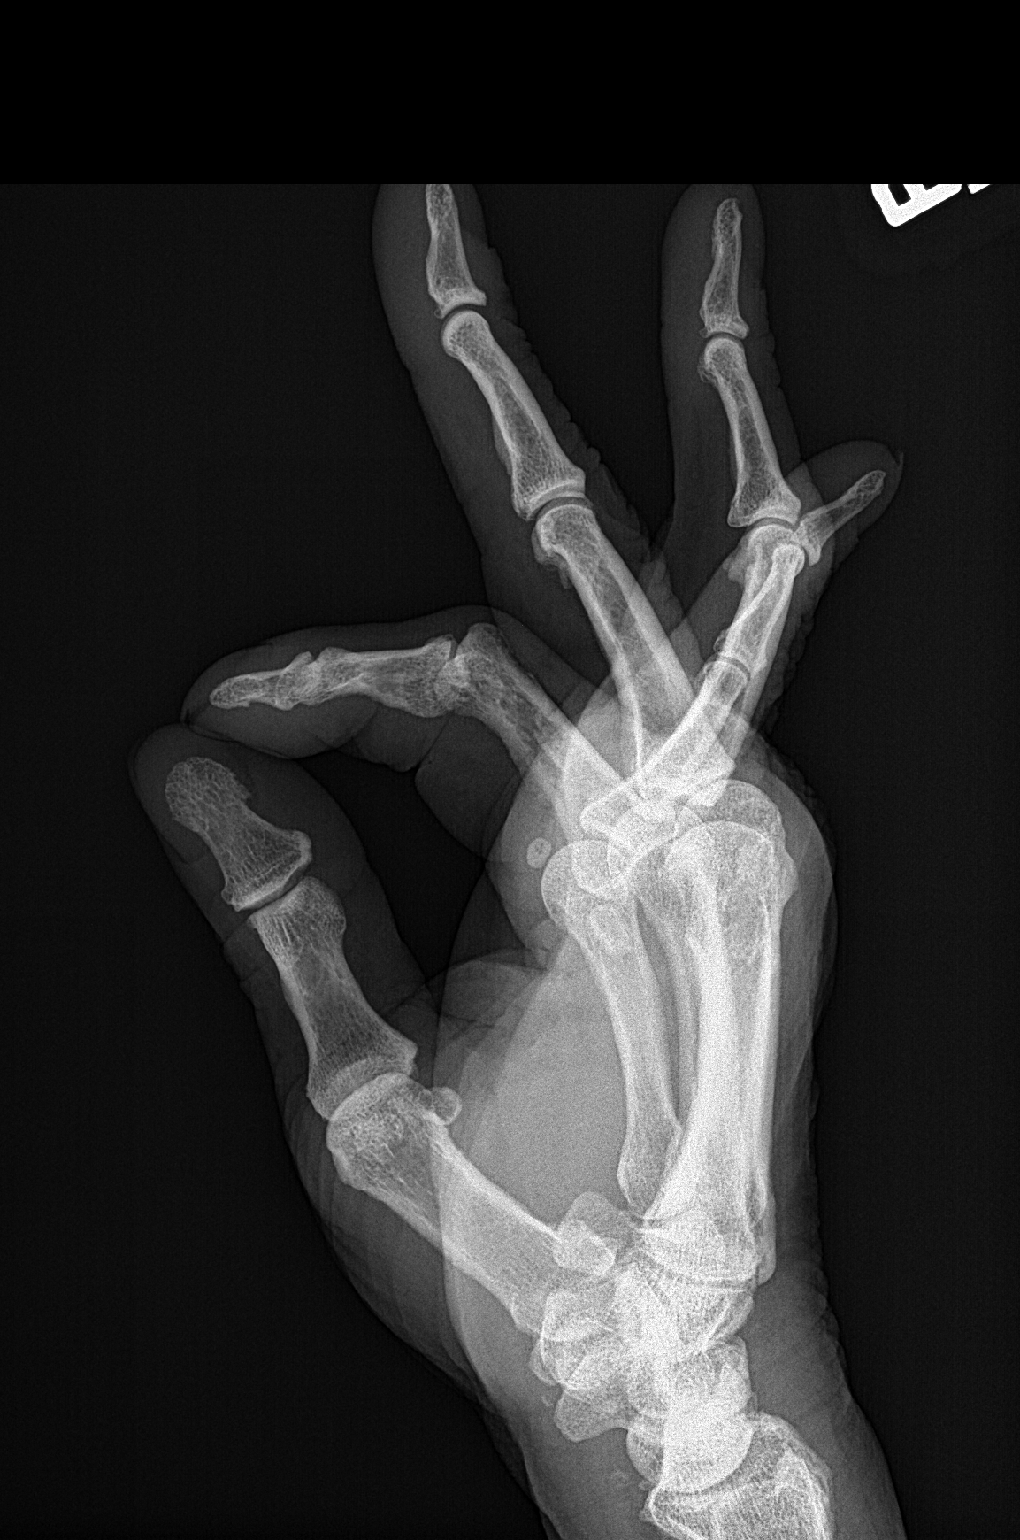

[3 of 3 positions shown; findings below may reference images not displayed]

FINDINGS: No fracture or dislocation. Degenerative changes are seen in the
first carpometacarpal joint and the second and third MCP joints.
Small subchondral or ganglion cyst noted in the head of the middle
finger metacarpal.
IMPRESSION: Degenerative changes in the first carpometacarpal joint and second
and third MCP joints.

## 2021-06-28 ENCOUNTER — Other Ambulatory Visit: Payer: Self-pay | Admitting: Family Medicine

## 2021-06-28 DIAGNOSIS — I10 Essential (primary) hypertension: Secondary | ICD-10-CM

## 2021-06-28 NOTE — Telephone Encounter (Signed)
Courtesy refill. Patient will need an office visit for further refills. Requested Prescriptions  Pending Prescriptions Disp Refills   amLODipine (NORVASC) 2.5 MG tablet [Pharmacy Med Name: AMLODIPINE BESYLATE 2.5 MG TAB] 90 tablet 0    Sig: TAKE 1 TABLET BY MOUTH EVERY DAY     Cardiovascular:  Calcium Channel Blockers Failed - 06/28/2021  1:42 AM      Failed - Valid encounter within last 6 months    Recent Outpatient Visits          6 months ago Essential (primary) hypertension   [facility] Birdie Sons, MD   8 months ago Hyperlipidemia, mixed   [facility] Birdie Sons, MD   11 months ago Pharyngitis, unspecified etiology   [facility] Birdie Sons, MD   1 year ago Essential (primary) hypertension   [facility] Birdie Sons, MD   3 years ago Coronary artery disease involving native coronary artery of native heart without angina pectoris   [place], MD             Passed - Last BP in normal range    BP Readings from Last 1 Encounters:  12/23/20 139/70

## 2021-07-23 ENCOUNTER — Other Ambulatory Visit: Payer: Self-pay | Admitting: Family Medicine

## 2021-07-23 DIAGNOSIS — I10 Essential (primary) hypertension: Secondary | ICD-10-CM

## 2021-07-23 NOTE — Telephone Encounter (Signed)
Requested medication (s) are due for refill today: yes  Requested medication (s) are on the active medication list: yes  Last refill:  06/28/21 #30 (courtesy RF)  Future visit scheduled: no  Notes to clinic:  called pt on cell phone and advised that his last refill was a 30 day courtesy RF. Advised ot to call office and get scheduled for an appt.   Requested Prescriptions  Pending Prescriptions Disp Refills   amLODipine (NORVASC) 2.5 MG tablet [Pharmacy Med Name: AMLODIPINE BESYLATE 2.5 MG TAB] 30 tablet 0    Sig: TAKE 1 TABLET BY MOUTH EVERY DAY     Cardiovascular:  Calcium Channel Blockers Failed - 07/23/2021 10:42 AM      Failed - Valid encounter within last 6 months    Recent Outpatient Visits           7 months ago Essential (primary) hypertension   [facility] Birdie Sons, MD   9 months ago Hyperlipidemia, mixed   [facility] Birdie Sons, MD   12 months ago Pharyngitis, unspecified etiology   [facility] Birdie Sons, MD   1 year ago Essential (primary) hypertension   [facility] Birdie Sons, MD   3 years ago Coronary artery disease involving native coronary artery of native heart without angina pectoris   [place], MD              Passed - Last BP in normal range    BP Readings from Last 1 Encounters:  12/23/20 139/70

## 2021-09-04 DIAGNOSIS — H2513 Age-related nuclear cataract, bilateral: Secondary | ICD-10-CM | POA: Diagnosis not present

## 2021-09-04 DIAGNOSIS — H524 Presbyopia: Secondary | ICD-10-CM | POA: Diagnosis not present

## 2021-09-04 DIAGNOSIS — H5203 Hypermetropia, bilateral: Secondary | ICD-10-CM | POA: Diagnosis not present

## 2021-09-04 DIAGNOSIS — H401131 Primary open-angle glaucoma, bilateral, mild stage: Secondary | ICD-10-CM | POA: Diagnosis not present

## 2021-09-16 ENCOUNTER — Other Ambulatory Visit: Payer: Self-pay | Admitting: Family Medicine

## 2021-09-16 DIAGNOSIS — M109 Gout, unspecified: Secondary | ICD-10-CM

## 2021-10-10 ENCOUNTER — Ambulatory Visit: Payer: BC Managed Care – PPO | Admitting: Family Medicine

## 2021-10-10 ENCOUNTER — Encounter: Payer: Self-pay | Admitting: Family Medicine

## 2021-10-10 ENCOUNTER — Other Ambulatory Visit: Payer: Self-pay

## 2021-10-10 VITALS — BP 153/67 | HR 51 | Temp 97.8°F | Resp 16 | Wt 185.0 lb

## 2021-10-10 DIAGNOSIS — E790 Hyperuricemia without signs of inflammatory arthritis and tophaceous disease: Secondary | ICD-10-CM

## 2021-10-10 DIAGNOSIS — E890 Postprocedural hypothyroidism: Secondary | ICD-10-CM | POA: Diagnosis not present

## 2021-10-10 DIAGNOSIS — I10 Essential (primary) hypertension: Secondary | ICD-10-CM | POA: Diagnosis not present

## 2021-10-10 DIAGNOSIS — E782 Mixed hyperlipidemia: Secondary | ICD-10-CM

## 2021-10-10 DIAGNOSIS — N529 Male erectile dysfunction, unspecified: Secondary | ICD-10-CM

## 2021-10-10 MED ORDER — TADALAFIL 5 MG PO TABS: 5.0000 mg | ORAL_TABLET | Freq: Every day | ORAL | 4 refills | Status: AC

## 2021-10-10 NOTE — Progress Notes (Signed)
?  ? ?I,Roshena L Chambers,acting as a scribe for Lelon Huh, MD.,have documented all relevant documentation on the behalf of Lelon Huh, MD,as directed by  Lelon Huh, MD while in the presence of Lelon Huh, MD.  ? ? ?Established patient visit ? ? ?Patient: John Liu   DOB: 22-May-1946   76 y.o. Male  MRN: [id number] ?Visit Date: 10/10/2021 ? ?Today's healthcare provider: Lelon Huh, MD  ? ?Chief Complaint  ?Patient presents with  ? Hypertension  ? Hypothyroidism  ? Gout  ? ?Subjective  ?  ?HPI  ?Hypertension, follow-up ? ?BP Readings from Last 3 Encounters:  ?10/10/21 (!) 153/67  ?12/23/20 139/70  ?10/14/20 (!) 158/70  ? Wt Readings from Last 3 Encounters:  ?10/10/21 185 lb (83.9 kg)  ?12/23/20 182 lb (82.6 kg)  ?10/14/20 187 lb 9.6 oz (85.1 kg)  ?  ? ?He was last seen for hypertension 9 months ago.  ?BP at that visit was 139/70. Management since that visit includes continue same medication. ? ?He reports good compliance with treatment. ?He is not having side effects.  ?He is following a Regular diet. ?He is not exercising. ?He does not smoke. ? ?Use of agents associated with hypertension: NSAIDS.  ? ?Outside blood pressures are not checked. ?Symptoms: ?No chest pain No chest pressure  ?No palpitations No syncope  ?No dyspnea No orthopnea  ?No paroxysmal nocturnal dyspnea No lower extremity edema  ? ?Pertinent labs ?Lab Results  ?Component Value Date  ? CHOL 168 10/19/2020  ? HDL 46 10/19/2020  ? LDLCALC 100 (H) 10/19/2020  ? TRIG 125 10/19/2020  ? CHOLHDL 3.7 10/19/2020  ? Lab Results  ?Component Value Date  ? NA 142 10/19/2020  ? K 4.3 10/19/2020  ? CREATININE 1.23 10/19/2020  ? EGFR 62 10/19/2020  ? GLUCOSE 104 (H) 10/19/2020  ? TSH 0.991 10/19/2020  ?  ? ?The 10-year ASCVD risk score (Arnett DK, et al., 2019) is: 27.4% ? ?---------------------------------------------------------------------------------------------------  ? ?Follow up for gout: ? ?The patient was last seen for this 9 months  ago. ?Changes made at last visit include none. Patient was counseled to start back on allopurinol and take indefinitely to reduce risk of gout flares in the future. He was advised to continue colchicine for one more month after starting back on allopurinol, then only take prn.   ? ?He reports good compliance with treatment. ?He feels that condition is Unchanged. His last gout flare up was 2 weeks ago. ?He is not having side effects.  ? ?-----------------------------------------------------------------------------------------  ? ?Hypothyroid, follow-up ? ?Lab Results  ?Component Value Date  ? TSH 0.991 10/19/2020  ? TSH 1.460 10/20/2019  ? TSH 1.300 07/07/2018  ? T4TOTAL 6.7 03/14/2016  ? ? ?Wt Readings from Last 3 Encounters:  ?10/10/21 185 lb (83.9 kg)  ?12/23/20 182 lb (82.6 kg)  ?10/14/20 187 lb 9.6 oz (85.1 kg)  ? ? ?He was last seen for hypothyroid 9 months ago.  ?Management since that visit includes continuing same medication. ?He reports good compliance with treatment. ?He is not having side effects.  ? ?Symptoms: ?No change in energy level No constipation  ?No diarrhea No heat / cold intolerance  ?No nervousness No palpitations  ?No weight changes   ? ?-----------------------------------------------------------------------------------------  ? ?Medications: ?Outpatient Medications Prior to Visit  ?Medication Sig  ? allopurinol (ZYLOPRIM) 100 MG tablet Take 1 tablet (100 mg total) by mouth daily.  ? amLODipine (NORVASC) 2.5 MG tablet TAKE 1 TABLET BY MOUTH EVERY DAY  ?  aspirin 81 MG tablet Take 1 tablet by mouth daily.  ? colchicine 0.6 MG tablet TAKE ONE TABLET DAILY FOR 30 DAYS THEN AS NEEDED FOR GOUT FLARES  ? fluticasone (FLONASE) 50 MCG/ACT nasal spray SPRAY 2 SPRAYS INTO EACH NOSTRIL EVERY DAY  ? levothyroxine (SYNTHROID) 100 MCG tablet TAKE 1 TABLET BY MOUTH EVERY DAY  ? rosuvastatin (CRESTOR) 20 MG tablet TAKE 1 TABLET BY MOUTH EVERY DAY  ? telmisartan (MICARDIS) 40 MG tablet TAKE 1 TABLET BY MOUTH  EVERY DAY  ? triamterene-hydrochlorothiazide (MAXZIDE-25) 37.5-25 MG tablet TAKE 1 TABLET BY MOUTH EVERY DAY  ? ?No facility-administered medications prior to visit.  ? ? ?Review of Systems  ?Constitutional:  Negative for appetite change, chills and fever.  ?Respiratory:  Negative for chest tightness, shortness of breath and wheezing.   ?Cardiovascular:  Negative for chest pain and palpitations.  ?Gastrointestinal:  Negative for abdominal pain, nausea and vomiting.  ? ? ?  Objective  ?  ?BP (!) 153/67 (BP Location: Right Arm, Patient Position: Sitting, Cuff Size: Large)   Pulse (!) 51   Temp 97.8 ?F (36.6 ?C) (Oral)   Resp 16   Wt 185 lb (83.9 kg)   SpO2 98%   BMI 26.54 kg/m?  ? ? ?Today's Vitals  ? 10/10/21 1614 10/10/21 1619  ?BP: (!) 161/63 (!) 153/67  ?Pulse: (!) 51   ?Resp: 16   ?Temp: 97.8 ?F (36.6 ?C)   ?TempSrc: Oral   ?SpO2: 98%   ?Weight: 185 lb (83.9 kg)   ? ?Body mass index is 26.54 kg/m?.  ? ?Physical Exam  ? ?General: Appearance:     ?Overweight male in no acute distress  ?Eyes:    PERRL, conjunctiva/corneas clear, EOM's intact       ?Lungs:     Clear to auscultation bilaterally, respirations unlabored  ?Heart:    Bradycardic. Normal rhythm. No murmurs, rubs, or gallops.    ?MS:   All extremities are intact.    ?Neurologic:   Awake, alert, oriented x 3. No apparent focal neurological defect.   ?   ?  ? Assessment & Plan  ?  ? ?1. Essential (primary) hypertension ?Uncontrolled, tolerating meds. Consider increasing amlodipine.  ? ?2. Hypothyroidism, postop ? ?- TSH ?- T4, free ? ?3. Hyperlipidemia, mixed ?He is tolerating rosuvastatin well with no adverse effects.   ?- CBC ?- Comprehensive metabolic panel ?- Lipid panel ? ?4. Hyperuricemia ?Has mild gout flares every few months.  ?- Uric acid ? ?5. Erectile dysfunction, unspecified erectile dysfunction type ?Tried prn Cialis and Viagra in past which caused headaches. Try low dose tadalafil (CIALIS) 5 MG tablet; Take 1 tablet (5 mg total) by mouth  daily.  Dispense: 30 tablet; Refill: 4  ?   ? ?The entirety of the information documented in the History of Present Illness, Review of Systems and Physical Exam were personally obtained by me. Portions of this information were initially documented by the CMA and reviewed by me for thoroughness and accuracy.   ? ? ?Lelon Huh, MD  ?[facility] ?[phone] (phone) ?[phone] (fax) ? ?[facility]  ?

## 2021-10-10 NOTE — Patient Instructions (Addendum)
Please review the attached list of medications and notify my office if there are any errors.  ? ?Please go to the lab draw station in [address] on the second floor of [facility]  when you are fasting for 8 hours. Normal hours are 8:00am to 11:30am and 1:00pm to 4:00pm Monday through Friday  ? ?We are going to increase the amlodipine to '5mg'$  with your next refill ?

## 2021-10-15 ENCOUNTER — Other Ambulatory Visit: Payer: Self-pay | Admitting: Family Medicine

## 2021-10-15 DIAGNOSIS — I10 Essential (primary) hypertension: Secondary | ICD-10-CM

## 2021-10-15 MED ORDER — AMLODIPINE BESYLATE 5 MG PO TABS: 5.0000 mg | ORAL_TABLET | Freq: Every day | ORAL | 3 refills | Status: DC

## 2021-10-16 DIAGNOSIS — E790 Hyperuricemia without signs of inflammatory arthritis and tophaceous disease: Secondary | ICD-10-CM | POA: Diagnosis not present

## 2021-10-16 DIAGNOSIS — E782 Mixed hyperlipidemia: Secondary | ICD-10-CM | POA: Diagnosis not present

## 2021-10-17 LAB — LIPID PANEL
Chol/HDL Ratio: 3 ratio (ref 0.0–5.0)
Cholesterol, Total: 159 mg/dL (ref 100–199)
HDL: 53 mg/dL (ref >39)
LDL Chol Calc (NIH): 89 mg/dL (ref 0–99)
Triglycerides: 89 mg/dL (ref 0–149)
VLDL Cholesterol Cal: 17 mg/dL (ref 5–40)

## 2021-10-17 LAB — COMPREHENSIVE METABOLIC PANEL
ALT: 54 IU/L — ABNORMAL HIGH (ref 0–44)
AST: 57 IU/L — ABNORMAL HIGH (ref 0–40)
Albumin/Globulin Ratio: 1.5 (ref 1.2–2.2)
Albumin: 3.8 g/dL (ref 3.7–4.7)
Alkaline Phosphatase: 151 IU/L — ABNORMAL HIGH (ref 44–121)
BUN/Creatinine Ratio: 9 — ABNORMAL LOW (ref 10–24)
BUN: 10 mg/dL (ref 8–27)
Bilirubin Total: 0.5 mg/dL (ref 0.0–1.2)
CO2: 23 mmol/L (ref 20–29)
Calcium: 8.9 mg/dL (ref 8.6–10.2)
Chloride: 106 mmol/L (ref 96–106)
Creatinine, Ser: 1.17 mg/dL (ref 0.76–1.27)
Globulin, Total: 2.5 g/dL (ref 1.5–4.5)
Glucose: 112 mg/dL — ABNORMAL HIGH (ref 70–99)
Potassium: 4.3 mmol/L (ref 3.5–5.2)
Sodium: 143 mmol/L (ref 134–144)
Total Protein: 6.3 g/dL (ref 6.0–8.5)
eGFR: 66 mL/min/{1.73_m2} (ref >59)

## 2021-10-17 LAB — CBC
Hematocrit: 39 % (ref 37.5–51.0)
Hemoglobin: 13.3 g/dL (ref 13.0–17.7)
MCH: 31.4 pg (ref 26.6–33.0)
MCHC: 34.1 g/dL (ref 31.5–35.7)
MCV: 92 fL (ref 79–97)
Platelets: 182 10*3/uL (ref 150–450)
RBC: 4.23 x10E6/uL (ref 4.14–5.80)
RDW: 15.1 % (ref 11.6–15.4)
WBC: 6.4 10*3/uL (ref 3.4–10.8)

## 2021-10-17 LAB — URIC ACID: Uric Acid: 4.5 mg/dL (ref 3.8–8.4)

## 2021-10-17 LAB — T4, FREE: Free T4: 1.29 ng/dL (ref 0.82–1.77)

## 2021-10-21 ENCOUNTER — Other Ambulatory Visit: Payer: Self-pay | Admitting: Family Medicine

## 2021-10-21 DIAGNOSIS — I10 Essential (primary) hypertension: Secondary | ICD-10-CM

## 2021-10-31 ENCOUNTER — Telehealth: Payer: Self-pay | Admitting: Family Medicine

## 2021-10-31 NOTE — Telephone Encounter (Signed)
Message left to Updat pt's wife that the med was the correct dose of '5mg'$  and is already called into the pharm. ?

## 2021-10-31 NOTE — Telephone Encounter (Signed)
Wife returned call for results. There is confusion on dose of Norvasc. Rx for Norvasc 5 mg sent to pharm on 10/15/21 for #90 with 3 RF. Dr. Maralyn Sago results note states to start Norvasc 2.5 mg to bring down BP. Pt has been on Norvasc 2.5 mg and BP was 153/67 HR 51 in office. ?By the way I spoke with pt's wife who states she is his DPR, it is not on record, she says she goes through this every time she calls. He works all day and can not use a phone then you are closed etc... ?

## 2021-10-31 NOTE — Telephone Encounter (Signed)
The correct dose if '5mg'$ . I forgot that I already sent in a prescription for the increased dose on 10/15/2021 ?

## 2021-10-31 NOTE — Telephone Encounter (Signed)
Pts wife called and stated the the pharmacy advised that they didn't receive an Rx for Amlodipine / advised Rx was sent on 4.2.23/ she stated she would call them go back back to check again  ?

## 2021-11-07 NOTE — Progress Notes (Signed)
October 31, 2021 ?Hoyle Barr, RN ?  ?   5:55 PM ?Note ?Message left to Updat pt's wife that the med was the correct dose of '5mg'$  and is already called into the pharm.  ?  ? ?

## 2021-11-30 DIAGNOSIS — H2513 Age-related nuclear cataract, bilateral: Secondary | ICD-10-CM | POA: Diagnosis not present

## 2021-11-30 DIAGNOSIS — H5203 Hypermetropia, bilateral: Secondary | ICD-10-CM | POA: Diagnosis not present

## 2021-11-30 DIAGNOSIS — H52223 Regular astigmatism, bilateral: Secondary | ICD-10-CM | POA: Diagnosis not present

## 2021-11-30 DIAGNOSIS — H401131 Primary open-angle glaucoma, bilateral, mild stage: Secondary | ICD-10-CM | POA: Diagnosis not present

## 2021-12-10 ENCOUNTER — Other Ambulatory Visit: Payer: Self-pay | Admitting: Family Medicine

## 2021-12-10 DIAGNOSIS — M109 Gout, unspecified: Secondary | ICD-10-CM

## 2021-12-19 DIAGNOSIS — H401131 Primary open-angle glaucoma, bilateral, mild stage: Secondary | ICD-10-CM | POA: Diagnosis not present

## 2021-12-19 DIAGNOSIS — H5203 Hypermetropia, bilateral: Secondary | ICD-10-CM | POA: Diagnosis not present

## 2021-12-19 DIAGNOSIS — H2513 Age-related nuclear cataract, bilateral: Secondary | ICD-10-CM | POA: Diagnosis not present

## 2021-12-19 DIAGNOSIS — H524 Presbyopia: Secondary | ICD-10-CM | POA: Diagnosis not present

## 2022-03-27 DIAGNOSIS — H401131 Primary open-angle glaucoma, bilateral, mild stage: Secondary | ICD-10-CM | POA: Diagnosis not present

## 2022-03-27 DIAGNOSIS — H5203 Hypermetropia, bilateral: Secondary | ICD-10-CM | POA: Diagnosis not present

## 2022-03-27 DIAGNOSIS — H2513 Age-related nuclear cataract, bilateral: Secondary | ICD-10-CM | POA: Diagnosis not present

## 2022-03-27 DIAGNOSIS — H524 Presbyopia: Secondary | ICD-10-CM | POA: Diagnosis not present

## 2022-04-02 ENCOUNTER — Other Ambulatory Visit: Payer: Self-pay | Admitting: Family Medicine

## 2022-05-22 DIAGNOSIS — H401131 Primary open-angle glaucoma, bilateral, mild stage: Secondary | ICD-10-CM | POA: Diagnosis not present

## 2022-05-22 DIAGNOSIS — H5203 Hypermetropia, bilateral: Secondary | ICD-10-CM | POA: Diagnosis not present

## 2022-05-22 DIAGNOSIS — H52223 Regular astigmatism, bilateral: Secondary | ICD-10-CM | POA: Diagnosis not present

## 2022-05-22 DIAGNOSIS — H2513 Age-related nuclear cataract, bilateral: Secondary | ICD-10-CM | POA: Diagnosis not present

## 2022-05-28 ENCOUNTER — Encounter: Payer: Self-pay | Admitting: Family Medicine

## 2022-05-28 ENCOUNTER — Ambulatory Visit
Admission: RE | Admit: 2022-05-28 | Discharge: 2022-05-28 | Disposition: A | Discharge status: Home / Self Care | Payer: BC Managed Care – PPO | Attending: Family Medicine | Admitting: Family Medicine

## 2022-05-28 ENCOUNTER — Ambulatory Visit: Payer: BC Managed Care – PPO | Admitting: Family Medicine

## 2022-05-28 ENCOUNTER — Ambulatory Visit
Admission: RE | Admit: 2022-05-28 | Discharge: 2022-05-28 | Disposition: A | Discharge status: Home / Self Care | Payer: BC Managed Care – PPO | Source: Ambulatory Visit | Attending: Family Medicine | Admitting: Family Medicine

## 2022-05-28 ENCOUNTER — Ambulatory Visit: Payer: Self-pay | Admitting: *Deleted

## 2022-05-28 VITALS — BP 152/74 | HR 60 | Resp 18 | Wt 179.0 lb

## 2022-05-28 DIAGNOSIS — R109 Unspecified abdominal pain: Secondary | ICD-10-CM | POA: Diagnosis not present

## 2022-05-28 DIAGNOSIS — R079 Chest pain, unspecified: Secondary | ICD-10-CM | POA: Diagnosis not present

## 2022-05-28 DIAGNOSIS — J9811 Atelectasis: Secondary | ICD-10-CM | POA: Diagnosis not present

## 2022-05-28 MED ORDER — TIZANIDINE HCL 4 MG PO TABS: 4.0000 mg | ORAL_TABLET | Freq: Four times a day (QID) | ORAL | 0 refills | Status: AC | PRN

## 2022-05-28 NOTE — Progress Notes (Signed)
    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 - denies blunt trauma to area or falls.  - pain has improved with rest over the weekend but still with some pain/spasm when changing positions or moving L arm. - h/o CAD but current pain feels different than previous heart attack  Duration: days Quality: sharp, soreness.  Location: L side, behind L breast Radiation: none Episode duration: seconds Frequency: intermittent Related to exertion: no Activity when pain started:  Trauma: no Aggravating factors: deep breathing Alleviating factors: rest Status: better Treatments attempted: rest  Current pain status:  only with movement of area Shortness of breath: no Cough: no Nausea: no Diaphoresis: no Palpitations: no   OBJECTIVE:   BP (!) 152/74 (BP Location: Right Arm, Cuff Size: Normal)   Pulse 60   Resp 18   Wt 179 lb (81.2 kg)   SpO2 96%   BMI 25.68 kg/m   Gen: well appearing, in NAD Card: RRR Lungs: CTAB MSK: L sided chest wall TTP just below L breast. No overlying skin changes or bruising. No edema. Pain reproduced with active ROM of lifting L arm above head and position change from sitting to lying.  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 EKG without ischemic findings. Will obtain CXR to assess for rib fracture given location and character of pain though lower likelihood. Recommend tylenol for pain relief given h/o CAD. Provide short term muscle relaxer for spasm. Work note provided. F/u prn.    Myles Gip, DO

## 2022-05-28 NOTE — Telephone Encounter (Addendum)
   Chief Complaint: pulled muscle- chest Symptoms: chest pain with movement only Frequency: started Friday- has improved  Pertinent Negatives: Patient denies other symptoms Disposition: '[]'$ ED /'[]'$ Urgent Care (no appt availability in office) / '[x]'$ Appointment(In office/virtual)/ '[]'$  [facility]/ '[]'$ Home Care/ '[]'$ Refused Recommended Disposition /'[]'$ [place] [facility]/ '[]'$  Follow-up with PCP Additional Notes:     Reason for Disposition  [1] MODERATE pain (e.g., interferes with normal activities) AND [2] high-risk adult (e.g., age > 60 years, osteoporosis, chronic steroid use)  Answer Assessment - Initial Assessment Questions 1. MECHANISM: "How did the injury happen?"     Lifting and pulling with left arm 2. ONSET: "When did the injury happen?" (e.g., minutes, hours, days ago)     Friday 3. LOCATION: "Where on the chest is the injury located?" "Where does it hurt?"     Left muscle-chest- no pain on right 4. CHEST OR RIB PAIN SEVERITY: "How bad is the pain?"  (e.g., Scale 1-10; mild, moderate, or severe)    - MILD (1-3): doesn't interfere with normal activities     - MODERATE (4-7): interferes with normal activities or awakens from sleep    - SEVERE (8-10): excruciating pain, unable to do any normal activities       Mild/moderate 5. BREATHING DIFFICULTY: "Are you having any difficulty breathing?" If Yes, ask: "How bad is it?"  (e.g., none, mild, moderate, severe)  No problem- sore 6: OTHER SYMPTOMS (e.g., cough, fever, rash)      no 7. PREGNANCY: "Is there any chance you are pregnant?" "When was your last menstrual period?"     na  Protocols used: Chest Injury - Bending, Lifting, or Twisting-A-AH

## 2022-06-01 ENCOUNTER — Encounter: Payer: Self-pay | Admitting: Family Medicine

## 2022-07-03 ENCOUNTER — Other Ambulatory Visit: Payer: Self-pay | Admitting: Family Medicine

## 2022-08-27 DIAGNOSIS — H52223 Regular astigmatism, bilateral: Secondary | ICD-10-CM | POA: Diagnosis not present

## 2022-08-27 DIAGNOSIS — H5203 Hypermetropia, bilateral: Secondary | ICD-10-CM | POA: Diagnosis not present

## 2022-08-27 DIAGNOSIS — H2513 Age-related nuclear cataract, bilateral: Secondary | ICD-10-CM | POA: Diagnosis not present

## 2022-08-27 DIAGNOSIS — H401131 Primary open-angle glaucoma, bilateral, mild stage: Secondary | ICD-10-CM | POA: Diagnosis not present

## 2022-10-04 ENCOUNTER — Other Ambulatory Visit: Payer: Self-pay | Admitting: Family Medicine

## 2022-10-13 ENCOUNTER — Other Ambulatory Visit: Payer: Self-pay | Admitting: Family Medicine

## 2022-10-13 DIAGNOSIS — I10 Essential (primary) hypertension: Secondary | ICD-10-CM

## 2022-12-03 DIAGNOSIS — H401131 Primary open-angle glaucoma, bilateral, mild stage: Secondary | ICD-10-CM | POA: Diagnosis not present

## 2022-12-03 DIAGNOSIS — H2513 Age-related nuclear cataract, bilateral: Secondary | ICD-10-CM | POA: Diagnosis not present

## 2022-12-03 DIAGNOSIS — H52223 Regular astigmatism, bilateral: Secondary | ICD-10-CM | POA: Diagnosis not present

## 2022-12-03 DIAGNOSIS — H524 Presbyopia: Secondary | ICD-10-CM | POA: Diagnosis not present

## 2022-12-03 DIAGNOSIS — H5203 Hypermetropia, bilateral: Secondary | ICD-10-CM | POA: Diagnosis not present

## 2022-12-07 ENCOUNTER — Other Ambulatory Visit: Payer: Self-pay | Admitting: Family Medicine

## 2022-12-29 ENCOUNTER — Other Ambulatory Visit: Payer: Self-pay | Admitting: Family Medicine

## 2023-01-10 ENCOUNTER — Other Ambulatory Visit: Payer: Self-pay | Admitting: Family Medicine

## 2023-01-10 DIAGNOSIS — I10 Essential (primary) hypertension: Secondary | ICD-10-CM

## 2023-01-10 NOTE — Telephone Encounter (Signed)
Called patient to schedule appt for medication refills. No answer, recording call can not be completed at this time. Unable to leave message to call back.

## 2023-01-10 NOTE — Telephone Encounter (Signed)
Requested medication (s) are due for refill today: yes  Requested medication (s) are on the active medication list: yes  Last refill:  10/15/22 #90 0 refills  Future visit scheduled: no   Notes to clinic:   do you want to give courtesy refill? Called patient to schedule appt for medication refills. No answer , unable to leave message.     Requested Prescriptions  Pending Prescriptions Disp Refills   amLODipine (NORVASC) 5 MG tablet [Pharmacy Med Name: AMLODIPINE BESYLATE 5 MG TAB] 90 tablet 0    Sig: TAKE 1 TABLET (5 MG TOTAL) BY MOUTH DAILY.     Cardiovascular: Calcium Channel Blockers 2 Failed - 01/10/2023  2:39 AM      Failed - Last BP in normal range    BP Readings from Last 1 Encounters:  05/28/22 (!) 152/74         Failed - Valid encounter within last 6 months    Recent Outpatient Visits           7 months ago Side pain   [place] [facility] Caro Laroche, DO   1 year ago Essential (primary) hypertension   [place] [facility] Malva Limes, MD   2 years ago Essential (primary) hypertension   [place] [facility] Malva Limes, MD   2 years ago Hyperlipidemia, mixed   [place] [facility] Malva Limes, MD   2 years ago Pharyngitis, unspecified etiology   [facility] Malva Limes, MD              Passed - Last Heart Rate in normal range    Pulse Readings from Last 1 Encounters:  05/28/22 60

## 2023-01-14 ENCOUNTER — Other Ambulatory Visit: Payer: Self-pay | Admitting: Family Medicine

## 2023-01-14 DIAGNOSIS — I10 Essential (primary) hypertension: Secondary | ICD-10-CM

## 2023-01-29 DIAGNOSIS — H401131 Primary open-angle glaucoma, bilateral, mild stage: Secondary | ICD-10-CM | POA: Diagnosis not present

## 2023-01-29 DIAGNOSIS — H2513 Age-related nuclear cataract, bilateral: Secondary | ICD-10-CM | POA: Diagnosis not present

## 2023-01-29 DIAGNOSIS — H52223 Regular astigmatism, bilateral: Secondary | ICD-10-CM | POA: Diagnosis not present

## 2023-01-29 DIAGNOSIS — H5203 Hypermetropia, bilateral: Secondary | ICD-10-CM | POA: Diagnosis not present

## 2023-01-29 DIAGNOSIS — H524 Presbyopia: Secondary | ICD-10-CM | POA: Diagnosis not present

## 2023-03-04 ENCOUNTER — Other Ambulatory Visit: Payer: Self-pay | Admitting: Family Medicine

## 2023-03-29 ENCOUNTER — Other Ambulatory Visit: Payer: Self-pay | Admitting: Family Medicine

## 2023-03-31 ENCOUNTER — Other Ambulatory Visit: Payer: Self-pay | Admitting: Family Medicine

## 2023-04-07 ENCOUNTER — Other Ambulatory Visit: Payer: Self-pay | Admitting: Family Medicine

## 2023-04-07 DIAGNOSIS — I10 Essential (primary) hypertension: Secondary | ICD-10-CM

## 2023-04-09 NOTE — Telephone Encounter (Signed)
Requested Prescriptions  Pending Prescriptions Disp Refills   telmisartan (MICARDIS) 40 MG tablet [Pharmacy Med Name: TELMISARTAN 40 MG TABLET] 90 tablet 0    Sig: TAKE 1 TABLET (40 MG TOTAL) BY MOUTH DAILY. PLEASE SCHEDULE OFFICE VISIT BEFORE ANY FUTURE REFILL     Cardiovascular:  Angiotensin Receptor Blockers Failed - 04/07/2023  8:30 AM      Failed - Cr in normal range and within 180 days    Creatinine, Ser  Date Value Ref Range Status  10/16/2021 1.17 0.76 - 1.27 mg/dL Final         Failed - K in normal range and within 180 days    Potassium  Date Value Ref Range Status  10/16/2021 4.3 3.5 - 5.2 mmol/L Final         Failed - Last BP in normal range    BP Readings from Last 1 Encounters:  05/28/22 (!) 152/74         Failed - Valid encounter within last 6 months    Recent Outpatient Visits           10 months ago Side pain   [place] [facility] Caro Laroche, DO   1 year ago Essential (primary) hypertension   [place] [facility] Malva Limes, MD   2 years ago Essential (primary) hypertension   [place] [facility] Malva Limes, MD   2 years ago Hyperlipidemia, mixed   [place] [facility] Malva Limes, MD   2 years ago Pharyngitis, unspecified etiology   [place] [facility] Malva Limes, MD              Passed - Patient is not pregnant       allopurinol (ZYLOPRIM) 100 MG tablet [Pharmacy Med Name: ALLOPURINOL 100 MG TABLET] 90 tablet 0    Sig: TAKE 1 TABLET (100 MG TOTAL) BY MOUTH DAILY. PLEASE SCHEDULE OFFICE VISIT BEFORE ANY FUTURE REFILL     Endocrinology:  Gout Agents - allopurinol Failed - 04/07/2023  8:30 AM      Failed - Uric Acid in normal range and within 360 days    Uric Acid  Date Value Ref Range Status  10/16/2021 4.5 3.8 - 8.4 mg/dL Final    Comment:               Therapeutic target for gout patients: <6.0         Failed - Cr in  normal range and within 360 days    Creatinine, Ser  Date Value Ref Range Status  10/16/2021 1.17 0.76 - 1.27 mg/dL Final         Failed - CBC within normal limits and completed in the last 12 months    WBC  Date Value Ref Range Status  10/16/2021 6.4 3.4 - 10.8 x10E3/uL Final   RBC  Date Value Ref Range Status  10/16/2021 4.23 4.14 - 5.80 x10E6/uL Final   Hemoglobin  Date Value Ref Range Status  10/16/2021 13.3 13.0 - 17.7 g/dL Final   Hematocrit  Date Value Ref Range Status  10/16/2021 39.0 37.5 - 51.0 % Final   MCHC  Date Value Ref Range Status  10/16/2021 34.1 31.5 - 35.7 g/dL Final   [facility]  Date Value Ref Range Status  10/16/2021 31.4 26.6 - 33.0 pg Final   MCV  Date Value Ref Range Status  10/16/2021 92 79 - 97 fL Final   No results  found for: "PLTCOUNTKUC", "LABPLAT", "POCPLA" RDW  Date Value Ref Range Status  10/16/2021 15.1 11.6 - 15.4 % Final         Passed - Valid encounter within last 12 months    Recent Outpatient Visits           10 months ago Side pain   [place] [facility] Caro Laroche, DO   1 year ago Essential (primary) hypertension   [place] [facility] Malva Limes, MD   2 years ago Essential (primary) hypertension    [facility] Malva Limes, MD   2 years ago Hyperlipidemia, mixed    [facility] Malva Limes, MD   2 years ago Pharyngitis, unspecified etiology   [place] [facility] Malva Limes, MD

## 2023-04-12 ENCOUNTER — Ambulatory Visit (INDEPENDENT_AMBULATORY_CARE_PROVIDER_SITE_OTHER): Payer: Medicare HMO | Admitting: Family Medicine

## 2023-04-12 VITALS — BP 199/80 | HR 55 | Temp 98.3°F | Resp 16 | Ht 70.0 in | Wt 185.0 lb

## 2023-04-12 DIAGNOSIS — E890 Postprocedural hypothyroidism: Secondary | ICD-10-CM | POA: Diagnosis not present

## 2023-04-12 DIAGNOSIS — I10 Essential (primary) hypertension: Secondary | ICD-10-CM

## 2023-04-12 DIAGNOSIS — E782 Mixed hyperlipidemia: Secondary | ICD-10-CM | POA: Diagnosis not present

## 2023-04-12 MED ORDER — AMLODIPINE BESYLATE 5 MG PO TABS: 5.0000 mg | ORAL_TABLET | Freq: Every day | ORAL | 1 refills | Status: DC

## 2023-04-12 MED ORDER — LEVOTHYROXINE SODIUM 100 MCG PO TABS: 100.0000 ug | ORAL_TABLET | Freq: Every day | ORAL | 1 refills | Status: DC

## 2023-04-12 NOTE — Progress Notes (Unsigned)
Established patient visit   Patient: John Liu   DOB: 1946/01/30   77 y.o. Male  MRN: [id number] Visit Date: 04/12/2023  Today's healthcare provider: Mila Merry, MD   Chief Complaint  Patient presents with   Hypothyroidism    Patient is about out of medication and thought he needed his level checked before getting a refill   Hypertension    Patient was last seen over a year ago.  He reports that he does not check his blood pressure outside of the office.  He states that he has been out of his Amlodipine for about 2-3 weeks   Hyperlipidemia   Subjective    Discussed the use of AI scribe software for clinical note transcription with the patient, who gave verbal consent to proceed.  History of Present Illness   The patient, with a history of hypertension, hyperlipidemia and hypothyroidism, presented for a routine follow-up. He reported feeling generally well, with no complaints of chest pain, heart flutter, or shortness of breath. However, he admitted to running out of his amlodipine for the past three to four weeks, which he believes may have resulted in elevated blood pressure readings. He also reported a recent hip accident but did not elaborate on the details or any resulting symptoms. He is also prescribed telmisartan and rosuvastatin and reports he is taking both of those.       Medications: Outpatient Medications Prior to Visit  Medication Sig   allopurinol (ZYLOPRIM) 100 MG tablet TAKE 1 TABLET (100 MG TOTAL) BY MOUTH DAILY. PLEASE SCHEDULE OFFICE VISIT BEFORE ANY FUTURE REFILL   amLODipine (NORVASC) 5 MG tablet TAKE 1 TABLET (5 MG TOTAL) BY MOUTH DAILY.   aspirin 81 MG tablet Take 1 tablet by mouth daily.   colchicine 0.6 MG tablet TAKE ONE TABLET DAILY FOR 30 DAYS THEN AS NEEDED FOR GOUT FLARES   fluticasone (FLONASE) 50 MCG/ACT nasal spray SPRAY 2 SPRAYS INTO EACH NOSTRIL EVERY DAY   levothyroxine (SYNTHROID) 100 MCG tablet TAKE 1 TABLET (100 MCG TOTAL) BY  MOUTH DAILY. PLEASE SCHEDULE OFFICE VISIT BEFORE ANY FUTURE REFILL.   rosuvastatin (CRESTOR) 20 MG tablet TAKE 1 TABLET BY MOUTH EVERY DAY   tadalafil (CIALIS) 5 MG tablet Take 1 tablet (5 mg total) by mouth daily.   telmisartan (MICARDIS) 40 MG tablet TAKE 1 TABLET (40 MG TOTAL) BY MOUTH DAILY. PLEASE SCHEDULE OFFICE VISIT BEFORE ANY FUTURE REFILL   tiZANidine (ZANAFLEX) 4 MG tablet Take 1 tablet (4 mg total) by mouth every 6 (six) hours as needed for muscle spasms.   No facility-administered medications prior to visit.    {See past labs  Heme  Chem  Endocrine  Serology  Results Review (optional):1}   Objective    BP (!) 199/80 (BP Location: Left Arm, Patient Position: Sitting, Cuff Size: Normal)   Pulse (!) 55   Temp 98.3 F (36.8 C) (Oral)   Resp 16   Ht 5\' 10"  (1.778 m)   Wt 185 lb (83.9 kg)   BMI 26.54 kg/m {Insert last BP/Wt (optional):23777}{See vitals history (optional):1}  Physical Exam   CARDIOVASCULAR: Heart sounds normal upon auscultation.      Assessment & Plan        Hypertension Elevated blood pressure likely due to non-compliance with amlodipine for the past 3-4 weeks. Patient is also on telmisartan. No recent blood pressure readings at home. No symptoms of chest pain, heart flutter, or shortness of breath. -Resume amlodipine 5mg  daily. -Check  blood pressure and complete blood work in 3 weeks.  Hypothyroidism Patient is nearing the end of current levothyroxine supply. No new symptoms reported. -Refill levothyroxine prescription. -Check thyroid function at next visit in 3 weeks.   Hyperlipidemia -Doing well with rosuvastatin Schedule appointment for 3 weeks prior to patient's trip to the [employer]. At this visit, check blood pressure, complete blood work, and assess thyroid function.    Return in about 3 weeks (around 05/03/2023).      Mila Merry, MD  [facility] [phone] (phone) [phone] (fax)  [facility]

## 2023-04-12 NOTE — Patient Instructions (Signed)
.   Please review the attached list of medications and notify my office if there are any errors.   . Please bring all of your medications to every appointment so we can make sure that our medication list is the same as yours.   

## 2023-04-19 ENCOUNTER — Other Ambulatory Visit: Payer: Self-pay | Admitting: Family Medicine

## 2023-04-19 DIAGNOSIS — Z1212 Encounter for screening for malignant neoplasm of rectum: Secondary | ICD-10-CM

## 2023-04-19 DIAGNOSIS — Z1211 Encounter for screening for malignant neoplasm of colon: Secondary | ICD-10-CM

## 2023-04-23 ENCOUNTER — Other Ambulatory Visit: Payer: Self-pay | Admitting: Family Medicine

## 2023-05-05 ENCOUNTER — Other Ambulatory Visit: Payer: Self-pay | Admitting: Family Medicine

## 2023-05-05 DIAGNOSIS — E890 Postprocedural hypothyroidism: Secondary | ICD-10-CM

## 2023-05-10 ENCOUNTER — Ambulatory Visit (INDEPENDENT_AMBULATORY_CARE_PROVIDER_SITE_OTHER): Payer: Medicare HMO | Admitting: Family Medicine

## 2023-05-10 ENCOUNTER — Encounter: Payer: Self-pay | Admitting: Family Medicine

## 2023-05-10 VITALS — BP 166/75 | HR 62 | Ht 70.0 in | Wt 186.2 lb

## 2023-05-10 DIAGNOSIS — I10 Essential (primary) hypertension: Secondary | ICD-10-CM

## 2023-05-10 DIAGNOSIS — E790 Hyperuricemia without signs of inflammatory arthritis and tophaceous disease: Secondary | ICD-10-CM | POA: Insufficient documentation

## 2023-05-10 DIAGNOSIS — E890 Postprocedural hypothyroidism: Secondary | ICD-10-CM | POA: Diagnosis not present

## 2023-05-10 DIAGNOSIS — Z125 Encounter for screening for malignant neoplasm of prostate: Secondary | ICD-10-CM

## 2023-05-10 DIAGNOSIS — E782 Mixed hyperlipidemia: Secondary | ICD-10-CM

## 2023-05-10 DIAGNOSIS — I2510 Atherosclerotic heart disease of native coronary artery without angina pectoris: Secondary | ICD-10-CM | POA: Diagnosis not present

## 2023-05-10 MED ORDER — LEVOTHYROXINE SODIUM 100 MCG PO TABS: 100.0000 ug | ORAL_TABLET | Freq: Every day | ORAL | 0 refills | Status: DC

## 2023-05-10 NOTE — Progress Notes (Signed)
Established patient visit   Patient: John Liu   DOB: [birth year]   77 y.o. Male  MRN: [id number] Visit Date: 05/10/2023  Today's healthcare provider: Mila Merry, MD   Chief Complaint  Patient presents with   Medical Management of Chronic Issues    3 week follow-up. Patient reports he has not checked at home and does not consume a low salt diet. He reports no symptoms   Subjective    Discussed the use of AI scribe software for clinical note transcription with the patient, who gave verbal consent to proceed.  History of Present Illness   The patient, with a history of hypertension, thyroid disease, hyperlipidemia, and gout, presents for a routine follow-up. He reports good tolerance of his current medications, including amlodipine for hypertension, thyroid medication, rosuvastatin for hyperlipidemia, and allopurinol for gout.  The patient had a brief period of non-compliance with amlodipine due to running out of the medication, but has since been adherent for the past couple of weeks. He also reports that he will soon run out of his thyroid medication due to a shorter than usual refill.  The patient states he had mild flare-up of gout when he first started allopurinol, but non since then. He denies any chest pain, heart flutters, shortness of breath, coughing, or wheezing. He quit smoking approximately 12-13 years ago following a stent placement.  The patient has not eaten on the day of the consultation and is due for lab work. He has not been monitoring his blood pressure at home, but it was noted to be high during the consultation.     Lab Results  Component Value Date   TSH 0.991 10/19/2020   T4TOTAL 6.7 03/14/2016   Lab Results  Component Value Date   NA 143 10/16/2021   K 4.3 10/16/2021   CREATININE 1.17 10/16/2021   EGFR 66 10/16/2021   GLUCOSE 112 (H) 10/16/2021   Lab Results  Component Value Date   CHOL 159 10/16/2021   HDL 53 10/16/2021   LDLCALC 89  10/16/2021   TRIG 89 10/16/2021   CHOLHDL 3.0 10/16/2021   Lab Results  Component Value Date   LABURIC 4.5 10/16/2021     Medications: Outpatient Medications Prior to Visit  Medication Sig   allopurinol (ZYLOPRIM) 100 MG tablet TAKE 1 TABLET (100 MG TOTAL) BY MOUTH DAILY. PLEASE SCHEDULE OFFICE VISIT BEFORE ANY FUTURE REFILL   amLODipine (NORVASC) 5 MG tablet Take 1 tablet (5 mg total) by mouth daily.   aspirin 81 MG tablet Take 1 tablet by mouth daily.   colchicine 0.6 MG tablet TAKE ONE TABLET DAILY FOR 30 DAYS THEN AS NEEDED FOR GOUT FLARES   fluticasone (FLONASE) 50 MCG/ACT nasal spray SPRAY 2 SPRAYS INTO EACH NOSTRIL EVERY DAY   rosuvastatin (CRESTOR) 20 MG tablet TAKE 1 TABLET BY MOUTH EVERY DAY   tadalafil (CIALIS) 5 MG tablet Take 1 tablet (5 mg total) by mouth daily.   telmisartan (MICARDIS) 40 MG tablet TAKE 1 TABLET (40 MG TOTAL) BY MOUTH DAILY. PLEASE SCHEDULE OFFICE VISIT BEFORE ANY FUTURE REFILL   tiZANidine (ZANAFLEX) 4 MG tablet Take 1 tablet (4 mg total) by mouth every 6 (six) hours as needed for muscle spasms.   [DISCONTINUED] levothyroxine (SYNTHROID) 100 MCG tablet Take 1 tablet (100 mcg total) by mouth daily.   No facility-administered medications prior to visit.   Review of Systems  Constitutional:  Negative for appetite change, chills and fever.  Respiratory:  Negative  for chest tightness, shortness of breath and wheezing.   Cardiovascular:  Negative for chest pain and palpitations.  Gastrointestinal:  Negative for abdominal pain, nausea and vomiting.       Objective    BP (!) 166/75 (BP Location: Right Arm, Patient Position: Sitting, Cuff Size: Normal)   Pulse 62   Ht 5\' 10"  (1.778 m)   Wt 186 lb 3.2 oz (84.5 kg)   SpO2 100%   BMI 26.72 kg/m   Physical Exam  Physical Exam   CHEST: Lungs clear to auscultation. CARDIOVASCULAR: Heart sounds normal.    No results found for any visits on 05/10/23.  Assessment & Plan       Hypertension Elevated  blood pressure today. Patient reported running out of amlodipine and has since resumed it. -Continue amlodipine. -Check blood pressure at home if possible. -Consider increasing amlodipine or adding thiazide after reviewing labs.   Hypothyroidism Patient is due to run out of thyroid medication while out of town. -Send 30-day prescription of thyroid medication to pharmacy today. -Check thyroid levels today and adjust medication if necessary.  Hyperlipidemia No reported issues. -Continue rosuvastatin.  Hyperuricemia No recent flare-ups. -Check uric acid -Continue allopurinol.    No follow-ups on file.     PSA for prostate cancer screening Mila Merry, MD  [facility] [phone] (phone) [phone] (fax)  [facility]

## 2023-05-11 LAB — COMPREHENSIVE METABOLIC PANEL
ALT: 62 [IU]/L — ABNORMAL HIGH (ref 0–44)
AST: 79 [IU]/L — ABNORMAL HIGH (ref 0–40)
Albumin: 4 g/dL (ref 3.8–4.8)
Alkaline Phosphatase: 152 [IU]/L — ABNORMAL HIGH (ref 44–121)
BUN/Creatinine Ratio: 13 (ref 10–24)
BUN: 13 mg/dL (ref 8–27)
Bilirubin Total: 0.4 mg/dL (ref 0.0–1.2)
CO2: 20 mmol/L (ref 20–29)
Calcium: 9.4 mg/dL (ref 8.6–10.2)
Chloride: 109 mmol/L — ABNORMAL HIGH (ref 96–106)
Creatinine, Ser: 0.99 mg/dL (ref 0.76–1.27)
Globulin, Total: 2.5 g/dL (ref 1.5–4.5)
Glucose: 110 mg/dL — ABNORMAL HIGH (ref 70–99)
Potassium: 4.1 mmol/L (ref 3.5–5.2)
Sodium: 143 mmol/L (ref 134–144)
Total Protein: 6.5 g/dL (ref 6.0–8.5)
eGFR: 80 mL/min/{1.73_m2} (ref >59)

## 2023-05-11 LAB — LIPID PANEL
Chol/HDL Ratio: 3.1 ratio (ref 0.0–5.0)
Cholesterol, Total: 153 mg/dL (ref 100–199)
HDL: 50 mg/dL (ref >39)
LDL Chol Calc (NIH): 80 mg/dL (ref 0–99)
Triglycerides: 129 mg/dL (ref 0–149)
VLDL Cholesterol Cal: 23 mg/dL (ref 5–40)

## 2023-05-11 LAB — CBC
Hematocrit: 39.7 % (ref 37.5–51.0)
Hemoglobin: 13.5 g/dL (ref 13.0–17.7)
MCH: 32.4 pg (ref 26.6–33.0)
MCHC: 34 g/dL (ref 31.5–35.7)
MCV: 95 fL (ref 79–97)
Platelets: 223 10*3/uL (ref 150–450)
RBC: 4.17 x10E6/uL (ref 4.14–5.80)
RDW: 13.2 % (ref 11.6–15.4)
WBC: 7 10*3/uL (ref 3.4–10.8)

## 2023-05-11 LAB — T4, FREE: Free T4: 1.38 ng/dL (ref 0.82–1.77)

## 2023-05-11 LAB — URIC ACID: Uric Acid: 4 mg/dL (ref 3.8–8.4)

## 2023-05-11 LAB — TSH: TSH: 0.683 u[IU]/mL (ref 0.450–4.500)

## 2023-05-11 LAB — PSA: Prostate Specific Ag, Serum: 1.7 ng/mL (ref 0.0–4.0)

## 2023-05-19 ENCOUNTER — Other Ambulatory Visit: Payer: Self-pay | Admitting: Family Medicine

## 2023-06-06 ENCOUNTER — Other Ambulatory Visit: Payer: Self-pay | Admitting: Family Medicine

## 2023-06-06 DIAGNOSIS — E890 Postprocedural hypothyroidism: Secondary | ICD-10-CM

## 2023-06-07 NOTE — Telephone Encounter (Signed)
Requested Prescriptions  Pending Prescriptions Disp Refills   levothyroxine (SYNTHROID) 100 MCG tablet [Pharmacy Med Name: LEVOTHYROXINE 100 MCG TABLET] 30 tablet 1    Sig: TAKE 1 TABLET BY MOUTH EVERY DAY     Endocrinology:  Hypothyroid Agents Passed - 06/06/2023  2:38 AM      Passed - TSH in normal range and within 360 days    TSH  Date Value Ref Range Status  05/10/2023 0.683 0.450 - 4.500 uIU/mL Final         Passed - Valid encounter within last 12 months    Recent Outpatient Visits           4 weeks ago Essential (primary) hypertension   [facility] Malva Limes, MD   1 month ago Hyperlipidemia, mixed   [place] [facility] Malva Limes, MD   1 year ago Side pain   [facility] Caro Laroche, DO   1 year ago Essential (primary) hypertension   [place] Malva Limes, MD   2 years ago Essential (primary) hypertension   [place] [facility] Malva Limes, MD

## 2023-07-05 ENCOUNTER — Other Ambulatory Visit: Payer: Self-pay | Admitting: Family Medicine

## 2023-07-05 DIAGNOSIS — I10 Essential (primary) hypertension: Secondary | ICD-10-CM

## 2023-07-05 NOTE — Telephone Encounter (Signed)
Requested Prescriptions  Pending Prescriptions Disp Refills   telmisartan (MICARDIS) 40 MG tablet [Pharmacy Med Name: TELMISARTAN 40 MG TABLET] 90 tablet 0    Sig: TAKE 1 TABLET (40 MG TOTAL) BY MOUTH DAILY. PLEASE SCHEDULE OFFICE VISIT BEFORE ANY FUTURE REFILL     Cardiovascular:  Angiotensin Receptor Blockers Failed - 07/05/2023 12:13 PM      Failed - Last BP in normal range    BP Readings from Last 1 Encounters:  05/10/23 (!) 166/75         Passed - Cr in normal range and within 180 days    Creatinine, Ser  Date Value Ref Range Status  05/10/2023 0.99 0.76 - 1.27 mg/dL Final         Passed - K in normal range and within 180 days    Potassium  Date Value Ref Range Status  05/10/2023 4.1 3.5 - 5.2 mmol/L Final         Passed - Patient is not pregnant      Passed - Valid encounter within last 6 months    Recent Outpatient Visits           1 month ago Essential (primary) hypertension   [place] [facility] Malva Limes, MD   2 months ago Hyperlipidemia, mixed   [place] [facility] Malva Limes, MD   1 year ago Side pain   [place] [facility] Caro Laroche, DO   1 year ago Essential (primary) hypertension   [facility] Malva Limes, MD   2 years ago Essential (primary) hypertension    [facility] Malva Limes, MD

## 2023-07-06 ENCOUNTER — Other Ambulatory Visit: Payer: Self-pay | Admitting: Family Medicine

## 2023-07-08 NOTE — Telephone Encounter (Signed)
Per visit 05/10/23- continue Requested Prescriptions  Pending Prescriptions Disp Refills   allopurinol (ZYLOPRIM) 100 MG tablet [Pharmacy Med Name: ALLOPURINOL 100 MG TABLET] 90 tablet 0    Sig: TAKE 1 TABLET (100 MG TOTAL) BY MOUTH DAILY. PLEASE SCHEDULE OFFICE VISIT BEFORE ANY FUTURE REFILL     Endocrinology:  Gout Agents - allopurinol Failed - 07/08/2023  2:54 PM      Failed - CBC within normal limits and completed in the last 12 months    WBC  Date Value Ref Range Status  05/10/2023 7.0 3.4 - 10.8 x10E3/uL Final   RBC  Date Value Ref Range Status  05/10/2023 4.17 4.14 - 5.80 x10E6/uL Final   Hemoglobin  Date Value Ref Range Status  05/10/2023 13.5 13.0 - 17.7 g/dL Final   Hematocrit  Date Value Ref Range Status  05/10/2023 39.7 37.5 - 51.0 % Final   MCHC  Date Value Ref Range Status  05/10/2023 34.0 31.5 - 35.7 g/dL Final   [facility]  Date Value Ref Range Status  05/10/2023 32.4 26.6 - 33.0 pg Final   MCV  Date Value Ref Range Status  05/10/2023 95 79 - 97 fL Final   No results found for: "PLTCOUNTKUC", "LABPLAT", "POCPLA" RDW  Date Value Ref Range Status  05/10/2023 13.2 11.6 - 15.4 % Final         Passed - Uric Acid in normal range and within 360 days    Uric Acid  Date Value Ref Range Status  05/10/2023 4.0 3.8 - 8.4 mg/dL Final    Comment:               Therapeutic target for gout patients: <6.0         Passed - Cr in normal range and within 360 days    Creatinine, Ser  Date Value Ref Range Status  05/10/2023 0.99 0.76 - 1.27 mg/dL Final         Passed - Valid encounter within last 12 months    Recent Outpatient Visits           1 month ago Essential (primary) hypertension   [place] [facility] Malva Limes, MD   2 months ago Hyperlipidemia, mixed   [place] [facility] Malva Limes, MD   1 year ago Side pain   [place] [facility] Caro Laroche, DO   1 year ago Essential (primary)  hypertension   [place] [facility] Malva Limes, MD   2 years ago Essential (primary) hypertension   [place] [facility] Malva Limes, MD

## 2023-08-01 DIAGNOSIS — H2513 Age-related nuclear cataract, bilateral: Secondary | ICD-10-CM | POA: Diagnosis not present

## 2023-08-01 DIAGNOSIS — H401131 Primary open-angle glaucoma, bilateral, mild stage: Secondary | ICD-10-CM | POA: Diagnosis not present

## 2023-08-01 DIAGNOSIS — H52223 Regular astigmatism, bilateral: Secondary | ICD-10-CM | POA: Diagnosis not present

## 2023-08-01 DIAGNOSIS — H524 Presbyopia: Secondary | ICD-10-CM | POA: Diagnosis not present

## 2023-08-01 DIAGNOSIS — H5203 Hypermetropia, bilateral: Secondary | ICD-10-CM | POA: Diagnosis not present

## 2023-08-16 DIAGNOSIS — H25012 Cortical age-related cataract, left eye: Secondary | ICD-10-CM | POA: Diagnosis not present

## 2023-08-16 DIAGNOSIS — H2512 Age-related nuclear cataract, left eye: Secondary | ICD-10-CM | POA: Diagnosis not present

## 2023-08-30 ENCOUNTER — Other Ambulatory Visit: Payer: Self-pay | Admitting: Family Medicine

## 2023-08-30 DIAGNOSIS — E890 Postprocedural hypothyroidism: Secondary | ICD-10-CM

## 2023-10-03 DIAGNOSIS — H401111 Primary open-angle glaucoma, right eye, mild stage: Secondary | ICD-10-CM | POA: Diagnosis not present

## 2023-10-03 DIAGNOSIS — H25011 Cortical age-related cataract, right eye: Secondary | ICD-10-CM | POA: Diagnosis not present

## 2023-10-03 DIAGNOSIS — H52209 Unspecified astigmatism, unspecified eye: Secondary | ICD-10-CM | POA: Diagnosis not present

## 2023-10-03 DIAGNOSIS — H2511 Age-related nuclear cataract, right eye: Secondary | ICD-10-CM | POA: Diagnosis not present

## 2023-10-05 ENCOUNTER — Other Ambulatory Visit: Payer: Self-pay | Admitting: Family Medicine

## 2023-10-05 DIAGNOSIS — I10 Essential (primary) hypertension: Secondary | ICD-10-CM

## 2023-10-06 ENCOUNTER — Other Ambulatory Visit: Payer: Self-pay | Admitting: Family Medicine

## 2023-10-28 ENCOUNTER — Other Ambulatory Visit: Payer: Self-pay

## 2023-10-28 ENCOUNTER — Emergency Department

## 2023-10-28 ENCOUNTER — Other Ambulatory Visit: Payer: Self-pay | Admitting: Family Medicine

## 2023-10-28 ENCOUNTER — Emergency Department: Admission: EM | Admit: 2023-10-28 | Discharge: 2023-10-28 | Disposition: A | Discharge status: Home / Self Care

## 2023-10-28 DIAGNOSIS — S4992XA Unspecified injury of left shoulder and upper arm, initial encounter: Secondary | ICD-10-CM | POA: Diagnosis not present

## 2023-10-28 DIAGNOSIS — I10 Essential (primary) hypertension: Secondary | ICD-10-CM | POA: Diagnosis not present

## 2023-10-28 DIAGNOSIS — R001 Bradycardia, unspecified: Secondary | ICD-10-CM | POA: Diagnosis not present

## 2023-10-28 DIAGNOSIS — M19012 Primary osteoarthritis, left shoulder: Secondary | ICD-10-CM | POA: Diagnosis not present

## 2023-10-28 DIAGNOSIS — T148XXA Other injury of unspecified body region, initial encounter: Secondary | ICD-10-CM

## 2023-10-28 DIAGNOSIS — M79602 Pain in left arm: Secondary | ICD-10-CM

## 2023-10-28 DIAGNOSIS — I2510 Atherosclerotic heart disease of native coronary artery without angina pectoris: Secondary | ICD-10-CM | POA: Diagnosis not present

## 2023-10-28 DIAGNOSIS — S56912A Strain of unspecified muscles, fascia and tendons at forearm level, left arm, initial encounter: Secondary | ICD-10-CM | POA: Diagnosis not present

## 2023-10-28 DIAGNOSIS — X58XXXA Exposure to other specified factors, initial encounter: Secondary | ICD-10-CM | POA: Insufficient documentation

## 2023-10-28 DIAGNOSIS — E890 Postprocedural hypothyroidism: Secondary | ICD-10-CM

## 2023-10-28 DIAGNOSIS — S46912A Strain of unspecified muscle, fascia and tendon at shoulder and upper arm level, left arm, initial encounter: Secondary | ICD-10-CM | POA: Diagnosis not present

## 2023-10-28 DIAGNOSIS — M79603 Pain in arm, unspecified: Secondary | ICD-10-CM | POA: Diagnosis not present

## 2023-10-28 LAB — BASIC METABOLIC PANEL WITH GFR
Anion gap: 10 (ref 5–15)
BUN: 11 mg/dL (ref 8–23)
CO2: 22 mmol/L (ref 22–32)
Calcium: 9.2 mg/dL (ref 8.9–10.3)
Chloride: 108 mmol/L (ref 98–111)
Creatinine, Ser: 1.06 mg/dL (ref 0.61–1.24)
GFR, Estimated: >60 mL/min (ref >60)
Glucose, Bld: 113 mg/dL — ABNORMAL HIGH (ref 70–99)
Potassium: 4 mmol/L (ref 3.5–5.1)
Sodium: 140 mmol/L (ref 135–145)

## 2023-10-28 LAB — CBC
HCT: 38.4 % — ABNORMAL LOW (ref 39.0–52.0)
Hemoglobin: 13.4 g/dL (ref 13.0–17.0)
MCH: 32.6 pg (ref 26.0–34.0)
MCHC: 34.9 g/dL (ref 30.0–36.0)
MCV: 93.4 fL (ref 80.0–100.0)
Platelets: 450 10*3/uL — ABNORMAL HIGH (ref 150–400)
RBC: 4.11 MIL/uL — ABNORMAL LOW (ref 4.22–5.81)
RDW: 13.1 % (ref 11.5–15.5)
WBC: 8.8 10*3/uL (ref 4.0–10.5)
nRBC: 0 % (ref 0.0–0.2)

## 2023-10-28 LAB — TROPONIN I (HIGH SENSITIVITY)
Troponin I (High Sensitivity): 7 ng/L (ref <18)
Troponin I (High Sensitivity): 7 ng/L (ref <18)

## 2023-10-28 MED ORDER — LIDOCAINE 5 % EX PTCH: 1.0000 | MEDICATED_PATCH | CUTANEOUS | 0 refills | Status: AC

## 2023-10-28 MED ORDER — ACETAMINOPHEN 500 MG PO TABS: 1000.0000 mg | ORAL_TABLET | Freq: Four times a day (QID) | ORAL | 2 refills | Status: AC | PRN

## 2023-10-28 NOTE — ED Triage Notes (Signed)
 Pt presents to ED with /c/o of L arm pain that started a week ago, pt states HX of stents, pt states tingling to L fingers, pt states this is what happened when he had his stents. NAD noted. No injury noted.

## 2023-10-28 NOTE — ED Provider Notes (Signed)
 [facility] Provider Note    Event Date/Time   First MD Initiated Contact with Patient 10/28/23 1821     (approximate)   History   Arm Pain  Pt presents to ED with /c/o of L arm pain that started a week ago, pt states HX of stents, pt states tingling to L fingers, pt states this is what happened when he had his stents. NAD noted. No injury noted.    HPI AERO DRUMMONDS is a 78 y.o. male CAD, hyperlipidemia, hypertension presents for evaluation of left arm pain - Present for about 1 week, atraumatic, primarily localizes to left triceps region - No preceding trauma, no weakness - Is positional - No chest pain, shortness of breath, abdominal pain, paresthesias - No radiation elsewhere       Physical Exam   Triage Vital Signs: ED Triage Vitals  Encounter Vitals Group     BP 10/28/23 1401 (!) 162/82     Systolic BP Percentile --      Diastolic BP Percentile --      Pulse Rate 10/28/23 1401 67     Resp 10/28/23 1401 18     Temp 10/28/23 1401 98.2 F (36.8 C)     Temp Source 10/28/23 1401 Oral     SpO2 10/28/23 1401 100 %     Weight 10/28/23 1402 185 lb (83.9 kg)     Height 10/28/23 1402 5\' 9"  (1.753 m)     Head Circumference --      Peak Flow --      Pain Score 10/28/23 1401 0     Pain Loc --      Pain Education --      Exclude from Growth Chart --     Most recent vital signs: Vitals:   10/28/23 1401 10/28/23 1850  BP: (!) 162/82 (!) 167/77  Pulse: 67 75  Resp: 18 16  Temp: 98.2 F (36.8 C) 97.8 F (36.6 C)  SpO2: 100% 98%     General: Awake, no distress.  CV:  Good peripheral perfusion. RRR, RP 2+ Resp:  Normal effort. CTAB Abd:  No distention. Nontender to deep palpation throughout LUE:   Full range of motion all joints, mild pain with ranging of shoulder, RP 2+, radian/median/ulnar motor intact, SI LT   ED Results / Procedures / Treatments   Labs (all labs ordered are listed, but only abnormal results are  displayed) Labs Reviewed  BASIC METABOLIC PANEL WITH GFR - Abnormal; Notable for the following components:      Result Value   Glucose, Bld 113 (*)    All other components within normal limits  CBC - Abnormal; Notable for the following components:   RBC 4.11 (*)    HCT 38.4 (*)    Platelets 450 (*)    All other components within normal limits  TROPONIN I (HIGH SENSITIVITY)  TROPONIN I (HIGH SENSITIVITY)     EKG  See ED course below   RADIOLOGY See ED course below    PROCEDURES:  Critical Care performed: No  Procedures   MEDICATIONS ORDERED IN ED: Medications - No data to display   IMPRESSION / MDM / ASSESSMENT AND PLAN / ED COURSE  I reviewed the triage vital signs and the nursing notes.                              DDX/MDM/AP: Differential diagnosis includes, but is not  limited to, very likely MSK strain, consider ACS, cervical radiculopathy, clinically suspect pulmonary embolism at this time, doubt pneumothorax, do not clinically suspect aortic dissection.  Plan: - Labs - EKG - Chest x-ray, x-ray left shoulder - Reassess  Patient's presentation is most consistent with acute presentation with potential threat to life or bodily function.  The patient is on the cardiac monitor to evaluate for evidence of arrhythmia and/or significant heart rate changes.  ED course below.  EKG nonischemic, serial troponins normal, chest x-ray and x-ray shoulder with no acute pathology.  Presentation overall very consistent with MSK strain.  Recommend Tylenol, lidocaine patches.  Do not suspect cardiac etiology at this time with very MSK history and exam and reassuring cardiac workup.  Plan for PMD follow-up.  ED return precautions in place.  Patient agrees with plan.  Clinical Course as of 10/28/23 1911  Mon Oct 28, 2023  1847 Ecg = sinus bradycardia, rate 59, no ST elevation or depression, no significant repolarization abnormality biphasic T wave in leads III, aVF  (nonspecific), left axis deviation.  Normal intervals.  Similar to prior from 2019. [MM]  1849 CBC, BMP unremarkable  Initial troponin normal, repeat pending [MM]  1849 Chest x-ray and x-ray left shoulder with no acute pathology on my interpretation, formal radiology reads reviewed [MM]    Clinical Course User Index [MM] Collis Deaner, MD     FINAL CLINICAL IMPRESSION(S) / ED DIAGNOSES   Final diagnoses:  Left arm pain  Musculoskeletal strain     Rx / DC Orders   ED Discharge Orders          Ordered    acetaminophen (TYLENOL) 500 MG tablet  Every 6 hours PRN        10/28/23 1910    lidocaine (LIDODERM) 5 %  Every 24 hours        10/28/23 1910             Note:  This document was prepared using Dragon voice recognition software and may include unintentional dictation errors.   Collis Deaner, MD 10/28/23 1911

## 2023-10-28 NOTE — ED Provider Triage Note (Signed)
 Emergency Medicine Provider Triage Evaluation Note  LAKOTA MARKGRAF , a 78 y.o. male  was evaluated in triage.  Pt complains of left arm pain.  Had similar symptoms when he had a heart attack.  Feels like it is more in his shoulder and does hurt more with movement.  However became concerned due to the previous heart attack.  Review of Systems  Positive:  Negative:   Physical Exam  There were no vitals taken for this visit. Gen:   Awake, no distress   Resp:  Normal effort  MSK:   Moves extremities without difficulty  Other:    Medical Decision Making  Medically screening exam initiated at 2:01 PM.  Appropriate orders placed.  Braxton Calico was informed that the remainder of the evaluation will be completed by another provider, this initial triage assessment does not replace that evaluation, and the importance of remaining in the ED until their evaluation is complete.     Delsie Figures, PA-C 10/28/23 1401

## 2023-10-28 NOTE — Discharge Instructions (Signed)
 Your evaluation in the emergency department was overall reassuring, I suspect you have a strain of the muscle of your left arm.  Your heart workup was normal.  Please do follow-up with your primary care doctor for reevaluation, and return to the emergency department with any new or worsening symptoms.

## 2023-11-24 ENCOUNTER — Other Ambulatory Visit: Payer: Self-pay | Admitting: Family Medicine

## 2023-11-24 DIAGNOSIS — E890 Postprocedural hypothyroidism: Secondary | ICD-10-CM

## 2023-12-16 DIAGNOSIS — H26493 Other secondary cataract, bilateral: Secondary | ICD-10-CM | POA: Diagnosis not present

## 2023-12-16 DIAGNOSIS — H26492 Other secondary cataract, left eye: Secondary | ICD-10-CM | POA: Diagnosis not present

## 2023-12-16 DIAGNOSIS — H401131 Primary open-angle glaucoma, bilateral, mild stage: Secondary | ICD-10-CM | POA: Diagnosis not present

## 2023-12-23 ENCOUNTER — Other Ambulatory Visit: Payer: Self-pay | Admitting: Family Medicine

## 2023-12-23 DIAGNOSIS — I10 Essential (primary) hypertension: Secondary | ICD-10-CM

## 2023-12-23 DIAGNOSIS — H401131 Primary open-angle glaucoma, bilateral, mild stage: Secondary | ICD-10-CM | POA: Diagnosis not present

## 2023-12-23 DIAGNOSIS — Z9889 Other specified postprocedural states: Secondary | ICD-10-CM | POA: Diagnosis not present

## 2023-12-23 DIAGNOSIS — Z0100 Encounter for examination of eyes and vision without abnormal findings: Secondary | ICD-10-CM | POA: Diagnosis not present

## 2023-12-23 DIAGNOSIS — H26492 Other secondary cataract, left eye: Secondary | ICD-10-CM | POA: Diagnosis not present

## 2024-01-03 ENCOUNTER — Other Ambulatory Visit: Payer: Self-pay | Admitting: Family Medicine

## 2024-02-11 ENCOUNTER — Other Ambulatory Visit: Payer: Self-pay | Admitting: Family Medicine

## 2024-02-11 ENCOUNTER — Ambulatory Visit (INDEPENDENT_AMBULATORY_CARE_PROVIDER_SITE_OTHER)

## 2024-02-11 DIAGNOSIS — Z0000 Encounter for general adult medical examination without abnormal findings: Secondary | ICD-10-CM

## 2024-02-11 DIAGNOSIS — E890 Postprocedural hypothyroidism: Secondary | ICD-10-CM

## 2024-02-11 DIAGNOSIS — I10 Essential (primary) hypertension: Secondary | ICD-10-CM

## 2024-02-11 NOTE — Patient Instructions (Addendum)
 Mr. John Liu , Thank you for taking time out of your busy schedule to complete your Annual Wellness Visit with me. I enjoyed our conversation and look forward to speaking with you again next year. I, as well as your care team,  appreciate your ongoing commitment to your health goals. Please review the following plan we discussed and let me know if I can assist you in the future.   Follow up Visits: Next Medicare AWV with our clinical staff:   02/16/25 @ 1:10 PM BY PHONE Have you seen your provider in the last 6 months (3 months if uncontrolled diabetes)? Yes   Clinician Recommendations:  Aim for 30 minutes of exercise or brisk walking, 6-8 glasses of water, and 5 servings of fruits and vegetables each day. TAKE CARE!      This is a list of the screening recommended for you and due dates:  Health Maintenance  Topic Date Due   Hepatitis C Screening  Never done   DTaP/Tdap/Td vaccine (1 - Tdap) Never done   Colon Cancer Screening  Never done   Screening for Lung Cancer  Never done   Zoster (Shingles) Vaccine (1 of 2) Never done   COVID-19 Vaccine (2 - 2024-25 season) 03/17/2023   Flu Shot  02/14/2024   Medicare Annual Wellness Visit  02/10/2025   Pneumococcal Vaccine for age over 62  Completed   Hepatitis B Vaccine  Aged Out   HPV Vaccine  Aged Out   Meningitis B Vaccine  Aged Out    Advanced directives: (ACP Link)Information on Advanced Care Planning can be found at [employer]  Print production planner Health Care Directives Advance Health Care Directives. [email or web address]  Advance Care Planning is important because it:  [x]  Makes sure you receive the medical care that is consistent with your values, goals, and preferences  [x]  It provides guidance to your family and loved ones and reduces their decisional burden about whether or not they are making the right decisions based on your wishes.  Follow the link provided in your after visit summary or read over the paperwork we have mailed to  you to help you started getting your Advance Directives in place. If you need assistance in completing these, please reach out to us  so that we can help you!

## 2024-02-11 NOTE — Progress Notes (Signed)
 Subjective:   John Liu is a 78 y.o. who presents for a Medicare Wellness preventive visit.  As a reminder, Annual Wellness Visits don't include a physical exam, and some assessments may be limited, especially if this visit is performed virtually. We may recommend an in-person follow-up visit with your provider if needed.  Visit Complete: Virtual I connected with  John Liu on 02/11/24 by a audio enabled telemedicine application and verified that I am speaking with the correct person using two identifiers.  Patient Location: Home  Provider Location: Office/Clinic  I discussed the limitations of evaluation and management by telemedicine. The patient expressed understanding and agreed to proceed.  Vital Signs: Because this visit was a virtual/telehealth visit, some criteria may be missing or patient reported. Any vitals not documented were not able to be obtained and vitals that have been documented are patient reported.  VideoDeclined- This patient declined Librarian, academic. Therefore the visit was completed with audio only.  Persons Participating in Visit: Patient.  AWV Questionnaire: No: Patient Medicare AWV questionnaire was not completed prior to this visit.  Cardiac Risk Factors include: advanced age (>86men, >58 women);dyslipidemia;male gender;hypertension     Objective:    There were no vitals filed for this visit. There is no height or weight on file to calculate BMI.     02/11/2024    1:19 PM  Advanced Directives  Does Patient Have a Medical Advance Directive? No  Would patient like information on creating a medical advance directive? No - Patient declined    Current Medications (verified) Outpatient Encounter Medications as of 02/11/2024  Medication Sig   acetaminophen  (TYLENOL ) 500 MG tablet Take 2 tablets (1,000 mg total) by mouth every 6 (six) hours as needed.   allopurinol  (ZYLOPRIM ) 100 MG tablet Take 1 tablet (100  mg total) by mouth daily. Please schedule office visit before any future refill   amLODipine  (NORVASC ) 5 MG tablet TAKE 1 TABLET (5 MG TOTAL) BY MOUTH DAILY.   aspirin 81 MG tablet Take 1 tablet by mouth daily.   levothyroxine  (SYNTHROID ) 100 MCG tablet TAKE 1 TABLET BY MOUTH EVERY DAY   rosuvastatin  (CRESTOR ) 20 MG tablet TAKE 1 TABLET BY MOUTH EVERY DAY   telmisartan  (MICARDIS ) 40 MG tablet Take 1 tablet (40 mg total) by mouth daily. Please schedule office visit before any future refill   tiZANidine  (ZANAFLEX ) 4 MG tablet Take 1 tablet (4 mg total) by mouth every 6 (six) hours as needed for muscle spasms.   colchicine  0.6 MG tablet TAKE ONE TABLET DAILY FOR 30 DAYS THEN AS NEEDED FOR GOUT FLARES   fluticasone  (FLONASE ) 50 MCG/ACT nasal spray SPRAY 2 SPRAYS INTO EACH NOSTRIL EVERY DAY (Patient not taking: Reported on 02/11/2024)   tadalafil  (CIALIS ) 5 MG tablet Take 1 tablet (5 mg total) by mouth daily. (Patient not taking: Reported on 02/11/2024)   No facility-administered encounter medications on file as of 02/11/2024.    Allergies (verified) Patient has no known allergies.   History: Past Medical History:  Diagnosis Date   CAD (coronary artery disease)    History of thyroid  cancer    Hyperglycemia    Hyperlipidemia    Hypertension    Thyroid  disease    Past Surgical History:  Procedure Laterality Date   cyst removal from head     THYROIDECTOMY  2006   Family History  Problem Relation Age of Onset   Breast cancer Mother    Cancer Sister  Lung cancer Brother    Social History   Socioeconomic History   Marital status: Married    Spouse name: Not on file   Number of children: Not on file   Years of education: Not on file   Highest education level: Not on file  Occupational History   Not on file  Tobacco Use   Smoking status: Former    Current packs/day: 0.00    Average packs/day: 0.8 packs/day for 30.0 years (22.5 ttl pk-yrs)    Types: Cigarettes    Start date:  05/16/1980    Quit date: 05/16/2010    Years since quitting: 13.7   Smokeless tobacco: Former  Substance and Sexual Activity   Alcohol use: Yes    Comment: Drinks beer; Moderate use   Drug use: No   Sexual activity: Not on file  Other Topics Concern   Not on file  Social History Narrative   Not on file   Social Drivers of Health   Financial Resource Strain: Low Risk  (02/11/2024)   Overall Financial Resource Strain (CARDIA)    Difficulty of Paying Living Expenses: Not hard at all  Food Insecurity: No Food Insecurity (02/11/2024)   Hunger Vital Sign    Worried About Running Out of Food in the Last Year: Never true    Ran Out of Food in the Last Year: Never true  Transportation Needs: No Transportation Needs (02/11/2024)   PRAPARE - Administrator, Civil Service (Medical): No    Lack of Transportation (Non-Medical): No  Physical Activity: Sufficiently Active (02/11/2024)   Exercise Vital Sign    Days of Exercise per Week: 3 days    Minutes of Exercise per Session: 60 min  Stress: No Stress Concern Present (02/11/2024)   [employer] of Occupational Health - Occupational Stress Questionnaire    Feeling of Stress: Not at all  Social Connections: Socially Isolated (02/11/2024)   Social Connection and Isolation Panel    Frequency of Communication with Friends and Family: Once a week    Frequency of Social Gatherings with Friends and Family: Never    Attends Religious Services: Never    Database administrator or Organizations: No    Attends Engineer, structural: Never    Marital Status: Married    Tobacco Counseling Counseling given: Not Answered    Clinical Intake:  Pre-visit preparation completed: Yes  Pain : No/denies pain     BMI - recorded: 26.7 Nutritional Status: BMI 25 -29 Overweight Nutritional Risks: None Diabetes: No  No results found for: HGBA1C   How often do you need to have someone help you when you read instructions,  pamphlets, or other written materials from your doctor or pharmacy?: 1 - Never  Interpreter Needed?: No  Information entered by :: John DAS, John Liu   Activities of Daily Living     02/11/2024    1:20 PM  In your present state of health, do you have any difficulty performing the following activities:  Hearing? 0  Vision? 0  Difficulty concentrating or making decisions? 0  Walking or climbing stairs? 1  Comment HIP PAIN  Dressing or bathing? 0  Doing errands, shopping? 0  Preparing Food and eating ? N  Using the Toilet? N  In the past six months, have you accidently leaked urine? N  Do you have problems with loss of bowel control? N  Managing your Medications? N  Managing your Finances? N  Housekeeping or managing your Housekeeping? N  Patient Care Team: Gasper Nancyann BRAVO, MD as PCP - General (Family Medicine) Hester Wolm PARAS, MD as Consulting Physician (Cardiology) Laurice Francis NOVAK, OD (Optometry)  I have updated your Care Teams any recent Medical Services you may have received from other providers in the past year.     Assessment:   This is a routine wellness examination for John Liu.  Hearing/Vision screen Hearing Screening - Comments:: NO AIDS Vision Screening - Comments:: [facility]- [facility]- APPT ONE MONTH AGO   Goals Addressed             This Visit's Progress    DIET - EAT MORE FRUITS AND VEGETABLES         Depression Screen     02/11/2024    1:17 PM 04/12/2023    4:34 PM 12/23/2020    4:08 PM 10/14/2020    4:02 PM 01/29/2018    3:29 PM  PHQ 2/9 Scores  PHQ - 2 Score 0 0 0 0 0  PHQ- 9 Score 0 0 0 0 0    Fall Risk     02/11/2024    1:20 PM 04/12/2023    4:34 PM 12/23/2020    4:08 PM 10/14/2020    4:02 PM 10/13/2019    4:00 PM  Fall Risk   Falls in the past year? 0 0 0 0 0   Number falls in past yr: 0 0 0 0 0  Injury with Fall? 0 0 0 0 0  Risk for fall due to : No Fall Risks  No Fall Risks    Follow up Falls evaluation completed;Falls prevention  discussed  Falls evaluation completed  Falls evaluation completed  Falls evaluation completed      Data saved with a previous flowsheet row definition    MEDICARE RISK AT HOME:  Medicare Risk at Home Any stairs in or around the home?: Yes If so, are there any without handrails?: No Home free of loose throw rugs in walkways, pet beds, electrical cords, etc?: Yes Adequate lighting in your home to reduce risk of falls?: Yes Life alert?: No Use of a cane, walker or w/c?: No Grab bars in the bathroom?: No Shower chair or bench in shower?: No Elevated toilet seat or a handicapped toilet?: No  TIMED UP AND GO:  Was the test performed?  No  Cognitive Function: 6CIT completed        02/11/2024    1:22 PM  6CIT Screen  What Year? 0 points  What month? 0 points  What time? 0 points  Count back from 20 0 points  Months in reverse 0 points  Repeat phrase 0 points  Total Score 0 points    Immunizations Immunization History  Administered Date(s) Administered   Influenza, High Dose Seasonal PF 06/04/2018   Janssen (J&J) SARS-COV-2 Vaccination 10/26/2019   Pneumococcal Conjugate-13 01/29/2018   Pneumococcal Polysaccharide-23 10/14/2020    Screening Tests Health Maintenance  Topic Date Due   Hepatitis C Screening  Never done   DTaP/Tdap/Td (1 - Tdap) Never done   Colonoscopy  Never done   Lung Cancer Screening  Never done   Zoster Vaccines- Shingrix (1 of 2) Never done   COVID-19 Vaccine (2 - 2024-25 season) 03/17/2023   INFLUENZA VACCINE  02/14/2024   Medicare Annual Wellness (AWV)  02/10/2025   Pneumococcal Vaccine: 50+ Years  Completed   Hepatitis B Vaccines  Aged Out   HPV VACCINES  Aged Out   Meningococcal B Vaccine  Aged Out  Health Maintenance  Health Maintenance Due  Topic Date Due   Hepatitis C Screening  Never done   DTaP/Tdap/Td (1 - Tdap) Never done   Colonoscopy  Never done   Lung Cancer Screening  Never done   Zoster Vaccines- Shingrix (1 of 2)  Never done   COVID-19 Vaccine (2 - 2024-25 season) 03/17/2023   Health Maintenance Items Addressed: UP TO DATE ON PNA SHOTS, DECLINES OTHER SHOTS; DECLINED COLONOSCOPY  Additional Screening:  Vision Screening: Recommended annual ophthalmology exams for early detection of glaucoma and other disorders of the eye. Would you like a referral to an eye doctor? No    Dental Screening: Recommended annual dental exams for proper oral hygiene  Community Resource Referral / Chronic Care Management: CRR required this visit?  No   CCM required this visit?  No   Plan:    I have personally reviewed and noted the following in the patient's chart:   Medical and social history Use of alcohol, tobacco or illicit drugs  Current medications and supplements including opioid prescriptions. Patient is not currently taking opioid prescriptions. Functional ability and status Nutritional status Physical activity Advanced directives List of other physicians Hospitalizations, surgeries, and ER visits in previous 12 months Vitals Screenings to include cognitive, depression, and falls Referrals and appointments  In addition, I have reviewed and discussed with patient certain preventive protocols, quality metrics, and best practice recommendations. A written personalized care plan for preventive services as well as general preventive health recommendations were provided to patient.   John GORMAN Das, John Liu   2/[birth year]74   After Visit Summary: (MyChart) Due to this being a telephonic visit, the after visit summary with patients personalized plan was offered to patient via MyChart   Notes: Nothing significant to report at this time.

## 2024-03-02 ENCOUNTER — Other Ambulatory Visit: Payer: Self-pay | Admitting: Family Medicine

## 2024-03-02 DIAGNOSIS — I10 Essential (primary) hypertension: Secondary | ICD-10-CM

## 2024-04-03 ENCOUNTER — Ambulatory Visit (INDEPENDENT_AMBULATORY_CARE_PROVIDER_SITE_OTHER): Admitting: Family Medicine

## 2024-04-03 ENCOUNTER — Encounter: Payer: Self-pay | Admitting: Family Medicine

## 2024-04-03 VITALS — BP 179/78 | HR 51 | Ht 70.0 in | Wt 180.0 lb

## 2024-04-03 DIAGNOSIS — E782 Mixed hyperlipidemia: Secondary | ICD-10-CM

## 2024-04-03 DIAGNOSIS — I10 Essential (primary) hypertension: Secondary | ICD-10-CM

## 2024-04-03 DIAGNOSIS — E790 Hyperuricemia without signs of inflammatory arthritis and tophaceous disease: Secondary | ICD-10-CM | POA: Diagnosis not present

## 2024-04-03 DIAGNOSIS — E890 Postprocedural hypothyroidism: Secondary | ICD-10-CM

## 2024-04-03 NOTE — Progress Notes (Signed)
 Established patient visit   Patient: John Liu   DOB: 1945-07-20   78 y.o. Male  MRN: [id number] Visit Date: 04/03/2024  Today's healthcare provider: Nancyann Perry, MD   Chief Complaint  Patient presents with   Medication Refill   Hypertension   Hypothyroidism   Hyperlipidemia   Subjective    Discussed the use of AI scribe software for clinical note transcription with the patient, who gave verbal consent to proceed.  History of Present Illness   John Liu is a 78 year old male who presents for a routine checkup.  He has been experiencing an increase in tinnitus, primarily in the right ear, accompanied by some hearing difficulty. He describes the tinnitus as 'aggravating'.  He is taking his blood pressure medication regularly, typically around midday, but did not take it today due to not having eaten. He does not monitor his blood pressure at home. No chest pain, heart flutters, shortness of breath, or swelling in his hands, feet, or ankles.  He continues to take allopurinol  for gout, and reports that he has not had a flare-up in about two years. He reports that his wife no longer prepares liver for him and that he does not eat shellfish often.       Medications: Outpatient Medications Prior to Visit  Medication Sig   allopurinol  (ZYLOPRIM ) 100 MG tablet TAKE 1 TABLET (100 MG TOTAL) BY MOUTH DAILY. PLEASE SCHEDULE OFFICE VISIT BEFORE ANY FUTURE REFILL   amLODipine  (NORVASC ) 5 MG tablet TAKE 1 TABLET (5 MG TOTAL) BY MOUTH DAILY.   aspirin 81 MG tablet Take 1 tablet by mouth daily.   colchicine  0.6 MG tablet TAKE ONE TABLET DAILY FOR 30 DAYS THEN AS NEEDED FOR GOUT FLARES   levothyroxine  (SYNTHROID ) 100 MCG tablet TAKE 1 TABLET BY MOUTH EVERY DAY   rosuvastatin  (CRESTOR ) 20 MG tablet TAKE 1 TABLET BY MOUTH EVERY DAY   telmisartan  (MICARDIS ) 40 MG tablet TAKE 1 TABLET BY MOUTH DAILY *PLEASE SCHEDULE OFFICE VISIT FOR REFILLS   acetaminophen  (TYLENOL ) 500 MG  tablet Take 2 tablets (1,000 mg total) by mouth every 6 (six) hours as needed. (Patient not taking: Reported on 04/03/2024)   fluticasone  (FLONASE ) 50 MCG/ACT nasal spray SPRAY 2 SPRAYS INTO EACH NOSTRIL EVERY DAY (Patient not taking: Reported on 04/03/2024)   tadalafil  (CIALIS ) 5 MG tablet Take 1 tablet (5 mg total) by mouth daily. (Patient not taking: Reported on 04/03/2024)   tiZANidine  (ZANAFLEX ) 4 MG tablet Take 1 tablet (4 mg total) by mouth every 6 (six) hours as needed for muscle spasms. (Patient not taking: Reported on 04/03/2024)   No facility-administered medications prior to visit.   Review of Systems  Constitutional:  Negative for appetite change, chills and fever.  Respiratory:  Negative for chest tightness, shortness of breath and wheezing.   Cardiovascular:  Negative for chest pain and palpitations.  Gastrointestinal:  Negative for abdominal pain, nausea and vomiting.       Objective    BP (!) 179/78 (BP Location: Left Arm, Patient Position: Sitting, Cuff Size: Normal)   Pulse (!) 51   Ht 5' 10 (1.778 m)   Wt 180 lb (81.6 kg)   SpO2 99%   BMI 25.83 kg/m   Physical Exam   General: Appearance:    Well developed, well nourished male in no acute distress  Eyes:    PERRL, conjunctiva/corneas clear, EOM's intact       Lungs:     Clear  to auscultation bilaterally, respirations unlabored  Heart:    Bradycardic. Normal rhythm. No murmurs, rubs, or gallops.    MS:   All extremities are intact.    Neurologic:   Awake, alert, oriented x 3. No apparent focal neurological defect.        Assessment & Plan    1. Essential (primary) hypertension (Primary) Uncontrolled, anticipate adding thiazide or increasing amlodipine  after reviewing labs. May need to increase allopurinol  if thiazide is added.   2. Hyperlipidemia, mixed He is tolerating rosuvastatin  well with no adverse effects.   - CBC - Comprehensive metabolic panel with GFR - Lipid panel  3. Hypothyroidism,  postop Doing well on current thyroid  replacement.  - T4 AND TSH  4. Hyperuricemia No gout flares since starting allopurinol . Consider increasing if hydrochlorothiazide is added to htn management.   - Uric acid      Nancyann Perry, MD  [facility] [phone] (phone) [phone] (fax)  [facility]

## 2024-04-04 ENCOUNTER — Ambulatory Visit: Payer: Self-pay | Admitting: Family Medicine

## 2024-04-04 DIAGNOSIS — I10 Essential (primary) hypertension: Secondary | ICD-10-CM

## 2024-04-04 LAB — LIPID PANEL
Chol/HDL Ratio: 2.4 ratio (ref 0.0–5.0)
Cholesterol, Total: 141 mg/dL (ref 100–199)
HDL: 58 mg/dL (ref >39)
LDL Chol Calc (NIH): 67 mg/dL (ref 0–99)
Triglycerides: 82 mg/dL (ref 0–149)
VLDL Cholesterol Cal: 16 mg/dL (ref 5–40)

## 2024-04-04 LAB — T4 AND TSH
T4, Total: 6.3 ug/dL (ref 4.5–12.0)
TSH: 0.83 u[IU]/mL (ref 0.450–4.500)

## 2024-04-04 LAB — COMPREHENSIVE METABOLIC PANEL WITH GFR
ALT: 53 IU/L — ABNORMAL HIGH (ref 0–44)
AST: 59 IU/L — ABNORMAL HIGH (ref 0–40)
Albumin: 4 g/dL (ref 3.8–4.8)
Alkaline Phosphatase: 159 IU/L — ABNORMAL HIGH (ref 47–123)
BUN/Creatinine Ratio: 10 (ref 10–24)
BUN: 10 mg/dL (ref 8–27)
Bilirubin Total: 0.5 mg/dL (ref 0.0–1.2)
CO2: 20 mmol/L (ref 20–29)
Calcium: 8.8 mg/dL (ref 8.6–10.2)
Chloride: 108 mmol/L — ABNORMAL HIGH (ref 96–106)
Creatinine, Ser: 1.05 mg/dL (ref 0.76–1.27)
Globulin, Total: 2.8 g/dL (ref 1.5–4.5)
Glucose: 97 mg/dL (ref 70–99)
Potassium: 4.5 mmol/L (ref 3.5–5.2)
Sodium: 143 mmol/L (ref 134–144)
Total Protein: 6.8 g/dL (ref 6.0–8.5)
eGFR: 74 mL/min/1.73 (ref >59)

## 2024-04-04 LAB — CBC
Hematocrit: 41.1 % (ref 37.5–51.0)
Hemoglobin: 13.5 g/dL (ref 13.0–17.7)
MCH: 32.1 pg (ref 26.6–33.0)
MCHC: 32.8 g/dL (ref 31.5–35.7)
MCV: 98 fL — ABNORMAL HIGH (ref 79–97)
Platelets: 202 x10E3/uL (ref 150–450)
RBC: 4.2 x10E6/uL (ref 4.14–5.80)
RDW: 13.7 % (ref 11.6–15.4)
WBC: 6.3 x10E3/uL (ref 3.4–10.8)

## 2024-04-04 LAB — URIC ACID: Uric Acid: 4.9 mg/dL (ref 3.8–8.4)

## 2024-04-06 MED ORDER — HYDROCHLOROTHIAZIDE 12.5 MG PO TABS: 12.5000 mg | ORAL_TABLET | Freq: Every day | ORAL | 0 refills | Status: AC

## 2024-04-15 DIAGNOSIS — H401131 Primary open-angle glaucoma, bilateral, mild stage: Secondary | ICD-10-CM | POA: Diagnosis not present

## 2024-04-15 NOTE — Progress Notes (Signed)
 John Liu                                          MRN: [id number]   04/15/2024   The [facility] Quality Team Specialist reviewed this patient medical record for the purposes of chart review for care gap closure. The following were reviewed: chart review for care gap closure-controlling blood pressure.    [facility] Quality Team

## 2024-04-24 ENCOUNTER — Other Ambulatory Visit: Payer: Self-pay | Admitting: Family Medicine

## 2024-05-22 ENCOUNTER — Encounter: Payer: Self-pay | Admitting: Family Medicine

## 2024-05-22 ENCOUNTER — Ambulatory Visit: Admitting: Family Medicine

## 2024-05-22 VITALS — BP 154/80 | HR 58 | Resp 16 | Ht 69.0 in | Wt 178.0 lb

## 2024-05-22 DIAGNOSIS — E790 Hyperuricemia without signs of inflammatory arthritis and tophaceous disease: Secondary | ICD-10-CM

## 2024-05-22 DIAGNOSIS — R7401 Elevation of levels of liver transaminase levels: Secondary | ICD-10-CM | POA: Diagnosis not present

## 2024-05-22 DIAGNOSIS — I10 Essential (primary) hypertension: Secondary | ICD-10-CM

## 2024-05-22 NOTE — Patient Instructions (Addendum)
 Please review the attached list of medications and notify my office if there are any errors.   Cut back on beer to 1 or 2 drinks a week  Continue your current medications, but we will increase the dose of amlodipine  to 10mg  when you get it refilled in December.

## 2024-05-22 NOTE — Progress Notes (Signed)
 Established patient visit   Patient: John Liu   DOB: 06-02-1946   78 y.o. Male  MRN: [id number] Visit Date: 05/22/2024  Today's healthcare provider: Nancyann Perry, MD   Chief Complaint  Patient presents with   Medical Management of Chronic Issues    Follow-up BP   Subjective    Discussed the use of AI scribe software for clinical note transcription with the patient, who gave verbal consent to proceed.  History of Present Illness   John Liu is a 78 year old male with hypertension who presents for a follow-up visit.  Six weeks ago, his blood pressure was 179/78 mmHg, and hydrochlorothiazide  12.5 mg was added to his regimen. His current blood pressure is 166 mmHg. He has not experienced any issues with the new medication and does not monitor his blood pressure at home.  He has a history of gout and hyperuricemia, with a uric acid level of 4.9 mg/dL at the last office visit. No recent gout flare-ups or swelling. He continues to take allopurinol  once daily.  His liver function tests were noted to be higher than normal at the last office visit. He consumes beer daily.     Last metabolic panel Lab Results  Component Value Date   GLUCOSE 97 04/03/2024   NA 143 04/03/2024   K 4.5 04/03/2024   CL 108 (H) 04/03/2024   CO2 20 04/03/2024   BUN 10 04/03/2024   CREATININE 1.05 04/03/2024   EGFR 74 04/03/2024   CALCIUM  8.8 04/03/2024   PHOS 3.8 03/14/2016   PROT 6.8 04/03/2024   ALBUMIN 4.0 04/03/2024   LABGLOB 2.8 04/03/2024   AGRATIO 1.5 10/16/2021   BILITOT 0.5 04/03/2024   ALKPHOS 159 (H) 04/03/2024   AST 59 (H) 04/03/2024   ALT 53 (H) 04/03/2024   ANIONGAP 10 10/28/2023   Lab Results  Component Value Date   LABURIC 4.9 04/03/2024     Medications: Outpatient Medications Prior to Visit  Medication Sig   allopurinol  (ZYLOPRIM ) 100 MG tablet TAKE 1 TABLET (100 MG TOTAL) BY MOUTH DAILY. PLEASE SCHEDULE OFFICE VISIT BEFORE ANY FUTURE REFILL    amLODipine  (NORVASC ) 5 MG tablet TAKE 1 TABLET (5 MG TOTAL) BY MOUTH DAILY.   aspirin 81 MG tablet Take 1 tablet by mouth daily.   colchicine  0.6 MG tablet TAKE ONE TABLET DAILY FOR 30 DAYS THEN AS NEEDED FOR GOUT FLARES   hydrochlorothiazide  (HYDRODIURIL ) 12.5 MG tablet Take 1 tablet (12.5 mg total) by mouth daily.   levothyroxine  (SYNTHROID ) 100 MCG tablet TAKE 1 TABLET BY MOUTH EVERY DAY   rosuvastatin  (CRESTOR ) 20 MG tablet TAKE 1 TABLET BY MOUTH EVERY DAY   telmisartan  (MICARDIS ) 40 MG tablet TAKE 1 TABLET BY MOUTH DAILY *PLEASE SCHEDULE OFFICE VISIT FOR REFILLS   acetaminophen  (TYLENOL ) 500 MG tablet Take 2 tablets (1,000 mg total) by mouth every 6 (six) hours as needed. (Patient not taking: Reported on 04/03/2024)   fluticasone  (FLONASE ) 50 MCG/ACT nasal spray SPRAY 2 SPRAYS INTO EACH NOSTRIL EVERY DAY (Patient not taking: Reported on 04/03/2024)   tadalafil  (CIALIS ) 5 MG tablet Take 1 tablet (5 mg total) by mouth daily. (Patient not taking: Reported on 04/03/2024)   tiZANidine  (ZANAFLEX ) 4 MG tablet Take 1 tablet (4 mg total) by mouth every 6 (six) hours as needed for muscle spasms. (Patient not taking: Reported on 05/22/2024)   No facility-administered medications prior to visit.   Review of Systems  Constitutional:  Negative for appetite change,  chills and fever.  Respiratory:  Negative for chest tightness, shortness of breath and wheezing.   Cardiovascular:  Negative for chest pain and palpitations.  Gastrointestinal:  Negative for abdominal pain, nausea and vomiting.       Objective    BP (!) 154/80 (BP Location: Right Arm, Patient Position: Sitting, Cuff Size: Normal)   Pulse (!) 58   Resp 16   Ht 5' 9 (1.753 m)   Wt 178 lb (80.7 kg)   SpO2 100%   BMI 26.29 kg/m   Physical Exam   General: Appearance:    Well developed, well nourished male in no acute distress  Eyes:    PERRL, conjunctiva/corneas clear, EOM's intact       Lungs:     Clear to auscultation bilaterally,  respirations unlabored  Heart:    Bradycardic. Normal rhythm. No murmurs, rubs, or gallops.    MS:   All extremities are intact.    Neurologic:   Awake, alert, oriented x 3. No apparent focal neurological defect.         Assessment & Plan     1. Essential (primary) hypertension (Primary) Improved but not to goal. Will increase amlodipine  to 10mg  with next refill due in December.   2. Hyperuricemia Very well controlled on current dose of allopurinol . No gout flares since starting on hydrochlorothiazide  after last visit.   3. Elevated transaminase level He admits to drinking beer most days and advised to cut back to 1 or 2 a week. Check transaminases at follow up appointment    Return in about 3 months (around 08/22/2024).     Nancyann Perry, MD  [facility] [phone] (phone) [phone] (fax)  [facility]

## 2024-05-24 ENCOUNTER — Other Ambulatory Visit: Payer: Self-pay | Admitting: Family Medicine

## 2024-05-24 DIAGNOSIS — I10 Essential (primary) hypertension: Secondary | ICD-10-CM

## 2024-05-29 NOTE — Progress Notes (Signed)
 John Liu                                          MRN: [id number]   05/29/2024   The [facility] Quality Team Specialist reviewed this patient medical record for the purposes of chart review for care gap closure. The following were reviewed: chart review for care gap closure-controlling blood pressure.    [facility] Quality Team

## 2024-06-05 ENCOUNTER — Other Ambulatory Visit: Payer: Self-pay

## 2024-06-05 DIAGNOSIS — E890 Postprocedural hypothyroidism: Secondary | ICD-10-CM

## 2024-06-05 DIAGNOSIS — Z1159 Encounter for screening for other viral diseases: Secondary | ICD-10-CM | POA: Diagnosis not present

## 2024-06-05 DIAGNOSIS — Z85850 Personal history of malignant neoplasm of thyroid: Secondary | ICD-10-CM

## 2024-06-05 DIAGNOSIS — Z8249 Family history of ischemic heart disease and other diseases of the circulatory system: Secondary | ICD-10-CM | POA: Diagnosis not present

## 2024-06-05 DIAGNOSIS — E790 Hyperuricemia without signs of inflammatory arthritis and tophaceous disease: Secondary | ICD-10-CM | POA: Diagnosis not present

## 2024-06-05 DIAGNOSIS — I10 Essential (primary) hypertension: Secondary | ICD-10-CM

## 2024-06-05 DIAGNOSIS — R7401 Elevation of levels of liver transaminase levels: Secondary | ICD-10-CM | POA: Diagnosis not present

## 2024-06-05 DIAGNOSIS — E782 Mixed hyperlipidemia: Secondary | ICD-10-CM

## 2024-06-06 ENCOUNTER — Ambulatory Visit: Payer: Self-pay | Admitting: Family Medicine

## 2024-06-06 DIAGNOSIS — R7401 Elevation of levels of liver transaminase levels: Secondary | ICD-10-CM

## 2024-06-06 LAB — COMPREHENSIVE METABOLIC PANEL WITH GFR
ALT: 52 IU/L — ABNORMAL HIGH (ref 0–44)
AST: 53 IU/L — ABNORMAL HIGH (ref 0–40)
Albumin: 4 g/dL (ref 3.8–4.8)
Alkaline Phosphatase: 148 IU/L — ABNORMAL HIGH (ref 47–123)
BUN/Creatinine Ratio: 8 — ABNORMAL LOW (ref 10–24)
BUN: 9 mg/dL (ref 8–27)
Bilirubin Total: 0.5 mg/dL (ref 0.0–1.2)
CO2: 22 mmol/L (ref 20–29)
Calcium: 9.2 mg/dL (ref 8.6–10.2)
Chloride: 105 mmol/L (ref 96–106)
Creatinine, Ser: 1.08 mg/dL (ref 0.76–1.27)
Globulin, Total: 2.6 g/dL (ref 1.5–4.5)
Glucose: 115 mg/dL — ABNORMAL HIGH (ref 70–99)
Potassium: 4 mmol/L (ref 3.5–5.2)
Sodium: 141 mmol/L (ref 134–144)
Total Protein: 6.6 g/dL (ref 6.0–8.5)
eGFR: 72 mL/min/1.73 (ref >59)

## 2024-06-06 LAB — TSH+FREE T4
Free T4: 1.16 ng/dL (ref 0.82–1.77)
TSH: 1.12 u[IU]/mL (ref 0.450–4.500)

## 2024-06-06 LAB — URIC ACID: Uric Acid: 6.5 mg/dL (ref 3.8–8.4)

## 2024-06-06 LAB — HEPATITIS B CORE ANTIBODY, TOTAL: Hep B Core Total Ab: NEGATIVE

## 2024-06-06 LAB — HEPATITIS C ANTIBODY: Hep C Virus Ab: NONREACTIVE

## 2024-06-19 ENCOUNTER — Other Ambulatory Visit: Payer: Self-pay | Admitting: Family Medicine

## 2024-06-19 DIAGNOSIS — I10 Essential (primary) hypertension: Secondary | ICD-10-CM

## 2024-06-19 MED ORDER — AMLODIPINE BESYLATE 10 MG PO TABS: 10.0000 mg | ORAL_TABLET | Freq: Every day | ORAL | 1 refills | Status: AC

## 2024-06-26 NOTE — Progress Notes (Signed)
 RYKAR LEBLEU                                          MRN: [id number]   06/26/2024   The [facility] Quality Team Specialist reviewed this patient medical record for the purposes of chart review for care gap closure. The following were reviewed: chart review for care gap closure-controlling blood pressure.    [facility] Quality Team

## 2024-06-27 ENCOUNTER — Other Ambulatory Visit: Payer: Self-pay | Admitting: Family Medicine

## 2024-07-03 ENCOUNTER — Other Ambulatory Visit: Payer: Self-pay | Admitting: Family Medicine

## 2024-07-03 DIAGNOSIS — I10 Essential (primary) hypertension: Secondary | ICD-10-CM

## 2024-08-05 NOTE — Progress Notes (Signed)
 John Liu                                          MRN: [id number]   08/05/2024   The [facility] Quality Team Specialist reviewed this patient medical record for the purposes of chart review for care gap closure. The following were reviewed: chart review for care gap closure-controlling blood pressure.    [facility] Quality Team

## 2024-08-12 ENCOUNTER — Other Ambulatory Visit: Payer: Self-pay | Admitting: Family Medicine

## 2024-08-12 DIAGNOSIS — E890 Postprocedural hypothyroidism: Secondary | ICD-10-CM

## 2024-08-24 ENCOUNTER — Ambulatory Visit: Admitting: Family Medicine

## 2024-08-24 DIAGNOSIS — E790 Hyperuricemia without signs of inflammatory arthritis and tophaceous disease: Secondary | ICD-10-CM

## 2024-08-24 DIAGNOSIS — R7401 Elevation of levels of liver transaminase levels: Secondary | ICD-10-CM

## 2024-08-24 DIAGNOSIS — I10 Essential (primary) hypertension: Secondary | ICD-10-CM

## 2024-08-24 DIAGNOSIS — E890 Postprocedural hypothyroidism: Secondary | ICD-10-CM

## 2025-02-16 ENCOUNTER — Ambulatory Visit
